# Patient Record
Sex: Female | Born: 1950 | Race: White | Hispanic: No | Marital: Married | Smoking: Never smoker
Health system: Southern US, Community
[De-identification: ages and names within clinical notes are randomized; demographics above are authoritative.]

## PROBLEM LIST (undated history)

## (undated) DIAGNOSIS — M8580 Other specified disorders of bone density and structure, unspecified site: Secondary | ICD-10-CM

## (undated) DIAGNOSIS — I10 Essential (primary) hypertension: Secondary | ICD-10-CM

---

## 1998-10-01 ENCOUNTER — Ambulatory Visit (HOSPITAL_COMMUNITY): Admission: RE | Admit: 1998-10-01 | Discharge: 1998-10-01 | Payer: Self-pay | Admitting: Family Medicine

## 2002-04-07 ENCOUNTER — Ambulatory Visit (HOSPITAL_COMMUNITY): Admission: RE | Admit: 2002-04-07 | Discharge: 2002-04-07 | Payer: Self-pay | Admitting: Gastroenterology

## 2003-08-19 ENCOUNTER — Ambulatory Visit (HOSPITAL_COMMUNITY): Admission: RE | Admit: 2003-08-19 | Discharge: 2003-08-19 | Payer: Self-pay | Admitting: Orthopedic Surgery

## 2003-08-19 ENCOUNTER — Encounter (INDEPENDENT_AMBULATORY_CARE_PROVIDER_SITE_OTHER): Payer: Self-pay | Admitting: *Deleted

## 2003-08-19 ENCOUNTER — Ambulatory Visit (HOSPITAL_BASED_OUTPATIENT_CLINIC_OR_DEPARTMENT_OTHER): Admission: RE | Admit: 2003-08-19 | Discharge: 2003-08-19 | Payer: Self-pay | Admitting: Orthopedic Surgery

## 2006-06-11 ENCOUNTER — Other Ambulatory Visit: Admission: RE | Admit: 2006-06-11 | Discharge: 2006-06-11 | Payer: Self-pay | Admitting: Family Medicine

## 2007-12-20 ENCOUNTER — Encounter: Admission: RE | Admit: 2007-12-20 | Discharge: 2007-12-20 | Payer: Self-pay | Admitting: Family Medicine

## 2010-12-30 NOTE — Op Note (Signed)
NAMELINDEY, RENZULLI                          ACCOUNT NO.:  [id number]   MEDICAL RECORD NO.:  [id number]                   PATIENT TYPE:  AMB   LOCATION:  DSC                                  FACILITY:  [facility]   PHYSICIAN:  Cindee Salt, M.D.                    DATE OF BIRTH:  1951/07/22   DATE OF PROCEDURE:  08/19/2003  DATE OF DISCHARGE:                                 OPERATIVE REPORT   PREOPERATIVE DIAGNOSIS:  Stenosing tenosynovitis, lesser sheath cyst, right  middle finger.   POSTOPERATIVE DIAGNOSIS:  Stenosing tenosynovitis, lesser sheath cyst, right  middle finger.   OPERATION PERFORMED:  Excision of cyst, release of A-1 pulley, right middle  finger.   SURGEON:  Cindee Salt, M.D.   ASSISTANTCarolyne Fiscal.   ANESTHESIA:  Forearm based  IV regional.   INDICATIONS FOR PROCEDURE:  The patient is a 60 year old female with a  history of triggering of her right middle finger.  She also has developed a  mass on the volar aspect.   DESCRIPTION OF PROCEDURE:  The patient was brought to the operating room  where a forearm based intravenous regional anesthetic was carried out  without difficulty.  She was prepped using DuraPrep in supine position with  the right arm free.  An oblique incision was made over the A-1 pulley,  carried down through subcutaneous tissue.  Bleeders were electrocauterized.  Neurovascular structures were identified and protected.  Retractors were  placed.  The A-1 pulley was identified.  The cyst was present at its distal  aspect.  This was removed and sent to pathology.  An incision was then made  on the radial aspect of the flexor sheath releasing this.  The finger placed  through a full range of motion.  Moderate tenosynovitis was present  proximally and this was excised.  The wound was irrigated.  Skin was closed  with interrupted 5-0 nylon suture.  Sterile compressive dressing was  applied.  The patient tolerated the procedure well and was taken to the  recovery room for observation in satisfactory condition.  She is discharged  to home to return to the [facility] [place] in one week on Vicodin  and Septra DS.                                               Cindee Salt, M.D.    Angelique Blonder  D:  08/19/2003  T:  08/19/2003  Job:  [id number]

## 2010-12-30 NOTE — Op Note (Signed)
   TNAMEJACOBA, CHERNEY                         ACCOUNT NO.:  [id number]   MEDICAL RECORD NO.:  [id number]                   PATIENT TYPE:  AMB   LOCATION:  ENDO                                 FACILITY:  [facility]   PHYSICIAN:  Barrie Folk, M.D.                  DATE OF BIRTH:  12/08/1950   DATE OF PROCEDURE:  DATE OF DISCHARGE:                                 OPERATIVE REPORT   PROCEDURE:  Colonoscopy.   INDICATIONS FOR PROCEDURE:  A 60 year old patient with anemia with a  hemoglobin of 10.9 but more recently had risen to 12.3.   DESCRIPTION OF PROCEDURE:  The patient was placed in the left lateral  decubitus position then placed on the pulse monitor with continuous low flow  oxygen delivered by nasal cannula. She was sedated with 80 mg IV Demerol and  9 mg IV Versed. The Olympus video colonoscope was inserted into the rectum  and advanced to the cecum, confirmed by transillumination at McBurney's  point and visualization of the ileocecal valve and appendiceal orifice. The  prep was excellent. The cecum, ascending, transverse, descending and sigmoid  colon all appeared normal with no masses, polyps, diverticula or other  mucosal abnormalities. The rectum likewise appeared normal and retroflexed  view of the anus revealed no obvious internal hemorrhoids. The colonoscope  was then withdrawn and the patient returned to the recovery room in stable  condition. The patient tolerated the procedure well and there were no  immediate complications.   IMPRESSION:  Normal colonoscopy.   PLAN:  Continue to monitor hemoglobin and consider upper EGD or upper GI  series of GI blood loss suspected, otherwise, flexible sigmoidoscopy in five  years.                                               Barrie Folk, M.D.    JCH/MEDQ  D:  04/07/2002  T:  04/08/2002  Job:  [id number]   cc:   Jethro Bastos, M.D.

## 2011-09-08 ENCOUNTER — Ambulatory Visit
Admission: RE | Admit: 2011-09-08 | Discharge: 2011-09-08 | Disposition: A | Discharge status: Home / Self Care | Payer: BC Managed Care – PPO | Source: Ambulatory Visit | Attending: Family Medicine | Admitting: Family Medicine

## 2011-09-08 ENCOUNTER — Other Ambulatory Visit: Payer: Self-pay | Admitting: Family Medicine

## 2011-09-08 DIAGNOSIS — M25559 Pain in unspecified hip: Secondary | ICD-10-CM

## 2011-09-13 ENCOUNTER — Ambulatory Visit: Payer: BC Managed Care – PPO | Attending: Family Medicine

## 2011-09-13 DIAGNOSIS — M25559 Pain in unspecified hip: Secondary | ICD-10-CM | POA: Insufficient documentation

## 2011-09-13 DIAGNOSIS — M899 Disorder of bone, unspecified: Secondary | ICD-10-CM | POA: Insufficient documentation

## 2011-09-13 DIAGNOSIS — M949 Disorder of cartilage, unspecified: Secondary | ICD-10-CM | POA: Insufficient documentation

## 2011-09-13 DIAGNOSIS — IMO0001 Reserved for inherently not codable concepts without codable children: Secondary | ICD-10-CM | POA: Insufficient documentation

## 2011-09-18 ENCOUNTER — Ambulatory Visit: Payer: BC Managed Care – PPO | Attending: Family Medicine

## 2011-09-18 DIAGNOSIS — IMO0001 Reserved for inherently not codable concepts without codable children: Secondary | ICD-10-CM | POA: Insufficient documentation

## 2011-09-18 DIAGNOSIS — M25559 Pain in unspecified hip: Secondary | ICD-10-CM | POA: Insufficient documentation

## 2011-09-18 DIAGNOSIS — M899 Disorder of bone, unspecified: Secondary | ICD-10-CM | POA: Insufficient documentation

## 2011-09-21 ENCOUNTER — Encounter: Payer: BC Managed Care – PPO | Admitting: Physical Therapy

## 2011-09-22 ENCOUNTER — Ambulatory Visit: Payer: BC Managed Care – PPO | Admitting: Physical Therapy

## 2011-09-22 ENCOUNTER — Encounter: Payer: BC Managed Care – PPO | Admitting: Physical Therapy

## 2011-09-26 ENCOUNTER — Ambulatory Visit: Payer: BC Managed Care – PPO

## 2011-09-29 ENCOUNTER — Ambulatory Visit: Payer: BC Managed Care – PPO | Admitting: Physical Therapy

## 2011-10-03 ENCOUNTER — Ambulatory Visit: Payer: BC Managed Care – PPO

## 2011-10-05 ENCOUNTER — Encounter: Payer: BC Managed Care – PPO | Admitting: Physical Therapy

## 2011-10-06 ENCOUNTER — Ambulatory Visit: Payer: BC Managed Care – PPO | Admitting: Physical Therapy

## 2011-10-10 ENCOUNTER — Ambulatory Visit: Payer: BC Managed Care – PPO

## 2011-10-16 ENCOUNTER — Ambulatory Visit: Payer: BC Managed Care – PPO | Attending: Family Medicine | Admitting: Physical Therapy

## 2011-10-16 DIAGNOSIS — M899 Disorder of bone, unspecified: Secondary | ICD-10-CM | POA: Insufficient documentation

## 2011-10-16 DIAGNOSIS — M949 Disorder of cartilage, unspecified: Secondary | ICD-10-CM | POA: Insufficient documentation

## 2011-10-16 DIAGNOSIS — M25559 Pain in unspecified hip: Secondary | ICD-10-CM | POA: Insufficient documentation

## 2011-10-16 DIAGNOSIS — IMO0001 Reserved for inherently not codable concepts without codable children: Secondary | ICD-10-CM | POA: Insufficient documentation

## 2011-10-20 ENCOUNTER — Ambulatory Visit: Payer: BC Managed Care – PPO | Admitting: Physical Therapy

## 2011-10-23 ENCOUNTER — Ambulatory Visit: Payer: BC Managed Care – PPO | Admitting: Physical Therapy

## 2011-10-25 ENCOUNTER — Ambulatory Visit: Payer: BC Managed Care – PPO

## 2012-01-16 ENCOUNTER — Other Ambulatory Visit (HOSPITAL_COMMUNITY)
Admission: RE | Admit: 2012-01-16 | Discharge: 2012-01-16 | Disposition: A | Discharge status: Home / Self Care | Payer: BC Managed Care – PPO | Source: Ambulatory Visit | Attending: Family Medicine | Admitting: Family Medicine

## 2012-01-16 ENCOUNTER — Other Ambulatory Visit: Payer: Self-pay | Admitting: Family Medicine

## 2012-01-16 DIAGNOSIS — Z01419 Encounter for gynecological examination (general) (routine) without abnormal findings: Secondary | ICD-10-CM | POA: Insufficient documentation

## 2012-01-16 DIAGNOSIS — Z1159 Encounter for screening for other viral diseases: Secondary | ICD-10-CM | POA: Insufficient documentation

## 2013-08-14 HISTORY — PX: TRIGGER FINGER RELEASE: SHX641

## 2015-07-19 ENCOUNTER — Other Ambulatory Visit: Payer: Self-pay | Admitting: Family Medicine

## 2015-07-19 DIAGNOSIS — I779 Disorder of arteries and arterioles, unspecified: Secondary | ICD-10-CM

## 2015-07-19 DIAGNOSIS — I739 Peripheral vascular disease, unspecified: Principal | ICD-10-CM

## 2015-07-26 ENCOUNTER — Ambulatory Visit
Admission: RE | Admit: 2015-07-26 | Discharge: 2015-07-26 | Disposition: A | Discharge status: Home / Self Care | Payer: BC Managed Care – PPO | Source: Ambulatory Visit | Attending: Family Medicine | Admitting: Family Medicine

## 2015-07-26 DIAGNOSIS — I739 Peripheral vascular disease, unspecified: Principal | ICD-10-CM

## 2015-07-26 DIAGNOSIS — I779 Disorder of arteries and arterioles, unspecified: Secondary | ICD-10-CM

## 2015-11-11 ENCOUNTER — Other Ambulatory Visit: Payer: Self-pay | Admitting: Orthopedic Surgery

## 2015-11-11 ENCOUNTER — Encounter (HOSPITAL_BASED_OUTPATIENT_CLINIC_OR_DEPARTMENT_OTHER)
Admission: RE | Admit: 2015-11-11 | Discharge: 2015-11-11 | Disposition: A | Discharge status: Home / Self Care | Payer: BC Managed Care – PPO | Source: Ambulatory Visit | Attending: Orthopedic Surgery | Admitting: Orthopedic Surgery

## 2015-11-11 ENCOUNTER — Encounter (HOSPITAL_BASED_OUTPATIENT_CLINIC_OR_DEPARTMENT_OTHER): Payer: Self-pay | Admitting: *Deleted

## 2015-11-11 ENCOUNTER — Other Ambulatory Visit: Payer: Self-pay

## 2015-11-11 DIAGNOSIS — Z01810 Encounter for preprocedural cardiovascular examination: Secondary | ICD-10-CM | POA: Diagnosis present

## 2015-11-11 DIAGNOSIS — I10 Essential (primary) hypertension: Secondary | ICD-10-CM | POA: Insufficient documentation

## 2015-11-16 ENCOUNTER — Encounter (HOSPITAL_BASED_OUTPATIENT_CLINIC_OR_DEPARTMENT_OTHER)
Admission: RE | Disposition: A | Discharge status: Home / Self Care | Payer: Self-pay | Source: Ambulatory Visit | Attending: Orthopedic Surgery

## 2015-11-16 ENCOUNTER — Ambulatory Visit (HOSPITAL_BASED_OUTPATIENT_CLINIC_OR_DEPARTMENT_OTHER): Payer: BC Managed Care – PPO | Admitting: Certified Registered"

## 2015-11-16 ENCOUNTER — Ambulatory Visit (HOSPITAL_BASED_OUTPATIENT_CLINIC_OR_DEPARTMENT_OTHER)
Admission: RE | Admit: 2015-11-16 | Discharge: 2015-11-16 | Disposition: A | Discharge status: Home / Self Care | Payer: BC Managed Care – PPO | Source: Ambulatory Visit | Attending: Orthopedic Surgery | Admitting: Orthopedic Surgery

## 2015-11-16 ENCOUNTER — Encounter (HOSPITAL_BASED_OUTPATIENT_CLINIC_OR_DEPARTMENT_OTHER): Payer: Self-pay | Admitting: Orthopedic Surgery

## 2015-11-16 DIAGNOSIS — I10 Essential (primary) hypertension: Secondary | ICD-10-CM | POA: Diagnosis not present

## 2015-11-16 DIAGNOSIS — M65332 Trigger finger, left middle finger: Secondary | ICD-10-CM | POA: Diagnosis not present

## 2015-11-16 DIAGNOSIS — Z79899 Other long term (current) drug therapy: Secondary | ICD-10-CM | POA: Diagnosis not present

## 2015-11-16 HISTORY — PX: TRIGGER FINGER RELEASE: SHX641

## 2015-11-16 HISTORY — DX: Essential (primary) hypertension: I10

## 2015-11-16 SURGERY — RELEASE, A1 PULLEY, FOR TRIGGER FINGER
Anesthesia: Monitor Anesthesia Care | Site: Finger | Laterality: Left

## 2015-11-16 MED ORDER — VANCOMYCIN HCL IN DEXTROSE 1-5 GM/200ML-% IV SOLN: 1000.0000 mg | INTRAVENOUS | Status: DC

## 2015-11-16 MED ORDER — LIDOCAINE HCL (PF) 0.5 % IJ SOLN
INTRAMUSCULAR | Status: DC | PRN
  Administered 2015-11-16: 30 mL via INTRAVENOUS

## 2015-11-16 MED ORDER — GLYCOPYRROLATE 0.2 MG/ML IJ SOLN: 0.2000 mg | Freq: Once | INTRAMUSCULAR | Status: DC | PRN

## 2015-11-16 MED ORDER — SCOPOLAMINE 1 MG/3DAYS TD PT72: 1.0000 | MEDICATED_PATCH | Freq: Once | TRANSDERMAL | Status: DC | PRN

## 2015-11-16 MED ORDER — LIDOCAINE HCL (CARDIAC) 20 MG/ML IV SOLN
INTRAVENOUS | Status: AC
  Filled 2015-11-16: qty 5

## 2015-11-16 MED ORDER — ONDANSETRON HCL 4 MG/2ML IJ SOLN
INTRAMUSCULAR | Status: AC
  Filled 2015-11-16: qty 2

## 2015-11-16 MED ORDER — ONDANSETRON HCL 4 MG/2ML IJ SOLN
INTRAMUSCULAR | Status: DC | PRN
  Administered 2015-11-16: 4 mg via INTRAVENOUS

## 2015-11-16 MED ORDER — PROMETHAZINE HCL 25 MG/ML IJ SOLN: 6.2500 mg | INTRAMUSCULAR | Status: DC | PRN

## 2015-11-16 MED ORDER — HYDROCODONE-ACETAMINOPHEN 5-325 MG PO TABS: 1.0000 | ORAL_TABLET | Freq: Four times a day (QID) | ORAL | Status: DC | PRN

## 2015-11-16 MED ORDER — VANCOMYCIN HCL IN DEXTROSE 1-5 GM/200ML-% IV SOLN
INTRAVENOUS | Status: AC
  Filled 2015-11-16: qty 200

## 2015-11-16 MED ORDER — FENTANYL CITRATE (PF) 100 MCG/2ML IJ SOLN: 25.0000 ug | INTRAMUSCULAR | Status: DC | PRN

## 2015-11-16 MED ORDER — MIDAZOLAM HCL 2 MG/2ML IJ SOLN
1.0000 mg | INTRAMUSCULAR | Status: DC | PRN
  Administered 2015-11-16: 1 mg via INTRAVENOUS

## 2015-11-16 MED ORDER — PROPOFOL 10 MG/ML IV BOLUS
INTRAVENOUS | Status: DC | PRN
  Administered 2015-11-16 (×3): 10 mg via INTRAVENOUS
  Administered 2015-11-16: 20 mg via INTRAVENOUS

## 2015-11-16 MED ORDER — MIDAZOLAM HCL 2 MG/2ML IJ SOLN
INTRAMUSCULAR | Status: AC
  Filled 2015-11-16: qty 2

## 2015-11-16 MED ORDER — LIDOCAINE HCL (CARDIAC) 20 MG/ML IV SOLN
INTRAVENOUS | Status: DC | PRN
  Administered 2015-11-16: 30 mg via INTRAVENOUS

## 2015-11-16 MED ORDER — MEPERIDINE HCL 25 MG/ML IJ SOLN: 6.2500 mg | INTRAMUSCULAR | Status: DC | PRN

## 2015-11-16 MED ORDER — CHLORHEXIDINE GLUCONATE 4 % EX LIQD: 60.0000 mL | Freq: Once | CUTANEOUS | Status: DC

## 2015-11-16 MED ORDER — FENTANYL CITRATE (PF) 100 MCG/2ML IJ SOLN
INTRAMUSCULAR | Status: AC
  Filled 2015-11-16: qty 2

## 2015-11-16 MED ORDER — LACTATED RINGERS IV SOLN
INTRAVENOUS | Status: DC
  Administered 2015-11-16: 10:00:00 via INTRAVENOUS

## 2015-11-16 MED ORDER — FENTANYL CITRATE (PF) 100 MCG/2ML IJ SOLN
50.0000 ug | INTRAMUSCULAR | Status: DC | PRN
Start: 2015-11-16 — End: 2015-11-16
  Administered 2015-11-16: 50 ug via INTRAVENOUS

## 2015-11-16 MED ORDER — BUPIVACAINE HCL (PF) 0.25 % IJ SOLN
INTRAMUSCULAR | Status: DC | PRN
  Administered 2015-11-16: 4 mL

## 2015-11-16 SURGICAL SUPPLY — 38 items
BANDAGE COBAN STERILE 2 (GAUZE/BANDAGES/DRESSINGS) ×3 IMPLANT
BLADE SURG 15 STRL LF DISP TIS (BLADE) ×1 IMPLANT
BLADE SURG 15 STRL SS (BLADE) ×3
BNDG CMPR 9X4 STRL LF SNTH (GAUZE/BANDAGES/DRESSINGS)
BNDG COHESIVE 1X5 TAN STRL LF (GAUZE/BANDAGES/DRESSINGS) ×2 IMPLANT
BNDG ESMARK 4X9 LF (GAUZE/BANDAGES/DRESSINGS) IMPLANT
CHLORAPREP W/TINT 26ML (MISCELLANEOUS) ×3 IMPLANT
CORDS BIPOLAR (ELECTRODE) IMPLANT
COVER BACK TABLE 60X90IN (DRAPES) ×3 IMPLANT
COVER MAYO STAND STRL (DRAPES) ×3 IMPLANT
CUFF TOURNIQUET SINGLE 18IN (TOURNIQUET CUFF) IMPLANT
DECANTER SPIKE VIAL GLASS SM (MISCELLANEOUS) IMPLANT
DRAPE EXTREMITY T 121X128X90 (DRAPE) ×3 IMPLANT
DRAPE SURG 17X23 STRL (DRAPES) ×3 IMPLANT
DRSG XEROFORM 1X8 (GAUZE/BANDAGES/DRESSINGS) ×2 IMPLANT
GAUZE SPONGE 4X4 12PLY STRL (GAUZE/BANDAGES/DRESSINGS) ×3 IMPLANT
GAUZE XEROFORM 1X8 LF (GAUZE/BANDAGES/DRESSINGS) ×3 IMPLANT
GLOVE BIO SURGEON STRL SZ 6 (GLOVE) ×2 IMPLANT
GLOVE BIO SURGEON STRL SZ 6.5 (GLOVE) ×1 IMPLANT
GLOVE BIO SURGEONS STRL SZ 6.5 (GLOVE) ×1
GLOVE BIOGEL PI IND STRL 8 (GLOVE) IMPLANT
GLOVE BIOGEL PI IND STRL 8.5 (GLOVE) ×1 IMPLANT
GLOVE BIOGEL PI INDICATOR 8 (GLOVE) ×2
GLOVE BIOGEL PI INDICATOR 8.5 (GLOVE) ×2
GLOVE SURG ORTHO 8.0 STRL STRW (GLOVE) ×3 IMPLANT
GOWN STRL REUS W/ TWL LRG LVL3 (GOWN DISPOSABLE) ×1 IMPLANT
GOWN STRL REUS W/TWL LRG LVL3 (GOWN DISPOSABLE) ×3
GOWN STRL REUS W/TWL XL LVL3 (GOWN DISPOSABLE) ×3 IMPLANT
NDL PRECISIONGLIDE 27X1.5 (NEEDLE) ×1 IMPLANT
NEEDLE PRECISIONGLIDE 27X1.5 (NEEDLE) ×3 IMPLANT
NS IRRIG 1000ML POUR BTL (IV SOLUTION) ×3 IMPLANT
PACK BASIN DAY SURGERY FS (CUSTOM PROCEDURE TRAY) ×3 IMPLANT
STOCKINETTE 4X48 STRL (DRAPES) ×3 IMPLANT
SUT ETHILON 4 0 PS 2 18 (SUTURE) ×6 IMPLANT
SYR BULB 3OZ (MISCELLANEOUS) ×3 IMPLANT
SYR CONTROL 10ML LL (SYRINGE) ×3 IMPLANT
TOWEL OR 17X24 6PK STRL BLUE (TOWEL DISPOSABLE) ×6 IMPLANT
UNDERPAD 30X30 (UNDERPADS AND DIAPERS) ×3 IMPLANT

## 2015-11-16 NOTE — Transfer of Care (Signed)
Immediate Anesthesia Transfer of Care Note  Patient: Samantha ShellSusan Steele  Procedure(s) Performed: Procedure(s) with comments: RELEASE TRIGGER FINGER/A-1 PULLEY LEFT MIDDLE FINGER (Left) - middle finger  Patient Location: PACU  Anesthesia Type:Bier block  Level of Consciousness: awake, alert , oriented and patient cooperative  Airway & Oxygen Therapy: Patient Spontanous Breathing and Patient connected to face mask oxygen  Post-op Assessment: Report given to RN and Post -op Vital signs reviewed and stable  Post vital signs: Reviewed and stable  Last Vitals:  Filed Vitals:   11/16/15 1014  BP: 143/65  Pulse: 68  Temp: 36.7 C  Resp: 18    Complications: No apparent anesthesia complications

## 2015-11-16 NOTE — Anesthesia Preprocedure Evaluation (Addendum)
Anesthesia Evaluation  Patient identified by MRN, date of birth, ID band Patient awake    Reviewed: Allergy & Precautions, NPO status , Patient's Chart, lab work & pertinent test results  Airway Mallampati: II  TM Distance: >3 FB Neck ROM: Full    Dental no notable dental hx.    Pulmonary neg pulmonary ROS,    Pulmonary exam normal breath sounds clear to auscultation       Cardiovascular hypertension, Pt. on medications Normal cardiovascular exam Rhythm:Regular Rate:Normal     Neuro/Psych negative neurological ROS  negative psych ROS   GI/Hepatic negative GI ROS, Neg liver ROS,   Endo/Other  negative endocrine ROS  Renal/GU negative Renal ROS  negative genitourinary   Musculoskeletal negative musculoskeletal ROS (+)   Abdominal   Peds negative pediatric ROS (+)  Hematology negative hematology ROS (+)   Anesthesia Other Findings   Reproductive/Obstetrics negative OB ROS                           Anesthesia Physical Anesthesia Plan  ASA: II  Anesthesia Plan: MAC and Bier Block   Post-op Pain Management:    Induction:   Airway Management Planned:   Additional Equipment:   Intra-op Plan:   Post-operative Plan:   Informed Consent: I have reviewed the patients History and Physical, chart, labs and discussed the procedure including the risks, benefits and alternatives for the proposed anesthesia with the patient or authorized representative who has indicated his/her understanding and acceptance.   Dental advisory given  Plan Discussed with: CRNA  Anesthesia Plan Comments:         Anesthesia Quick Evaluation

## 2015-11-16 NOTE — Anesthesia Procedure Notes (Signed)
Anesthesia Regional Block:  Bier block (IV Regional)  Pre-Anesthetic Checklist: ,, timeout performed, Correct Patient, Correct Site, Correct Laterality, Correct Procedure,, site marked, surgical consent,, at surgeon's request Needles:  Injection technique: Single-shot  Needle Type: Other      Needle Gauge: 20 and 20 G    Additional Needles: Bier block (IV Regional) Narrative:   Performed by: Personally    Procedure Name: MAC Date/Time: 11/16/2015 10:45 AM Performed by: Dorin Stooksbury D Pre-anesthesia Checklist: Patient identified, Emergency Drugs available, Suction available, Patient being monitored and Timeout performed Patient Re-evaluated:Patient Re-evaluated prior to inductionOxygen Delivery Method: Simple face mask

## 2015-11-16 NOTE — Brief Op Note (Signed)
11/16/2015  11:15 AM  PATIENT:  Samantha ShellSusan Steele  65 y.o. female  PRE-OPERATIVE DIAGNOSIS:  TRIGGER LEFT MIDDLE FINGER  POST-OPERATIVE DIAGNOSIS:  TRIGGER LEFT MIDDLE FINGE  PROCEDURE:  Procedure(s) with comments: RELEASE TRIGGER FINGER/A-1 PULLEY LEFT MIDDLE FINGER (Left) - middle finger  SURGEON:  Surgeon(s) and Role:    * Cindee SaltGary Martita Brumm, MD - Primary  PHYSICIAN ASSISTANT:   ASSISTANTS: none   ANESTHESIA:   local and regional  EBL:  Total I/O In: 500 [I.V.:500] Out: -   BLOOD ADMINISTERED:none  DRAINS: none   LOCAL MEDICATIONS USED:  BUPIVICAINE   SPECIMEN:  No Specimen  DISPOSITION OF SPECIMEN:  N/A  COUNTS:  YES  TOURNIQUET:   Total Tourniquet Time Documented: Upper Arm (Left) - 15 minutes Total: Upper Arm (Left) - 15 minutes   DICTATION: .Other Dictation: Dictation Number [id number]  PLAN OF CARE: Discharge to home after PACU  PATIENT DISPOSITION:  PACU - hemodynamically stable.

## 2015-11-16 NOTE — Op Note (Signed)
Dictation Number [id number]

## 2015-11-16 NOTE — Discharge Instructions (Addendum)

## 2015-11-16 NOTE — Anesthesia Postprocedure Evaluation (Signed)
Anesthesia Post Note  Patient: Samantha ShellSusan Steele  Procedure(s) Performed: Procedure(s) (LRB): RELEASE TRIGGER FINGER/A-1 PULLEY LEFT MIDDLE FINGER (Left)  Patient location during evaluation: PACU Anesthesia Type: MAC and Regional Level of consciousness: awake and alert Pain management: pain level controlled Vital Signs Assessment: post-procedure vital signs reviewed and stable Respiratory status: spontaneous breathing, nonlabored ventilation, respiratory function stable and patient connected to nasal cannula oxygen Cardiovascular status: stable and blood pressure returned to baseline Anesthetic complications: no    Last Vitals:  Filed Vitals:   11/16/15 1145 11/16/15 1215  BP: 125/65 144/70  Pulse: 56 59  Temp:  36.5 C  Resp: 9 16    Last Pain:  Filed Vitals:   11/16/15 1218  PainSc: 0-No pain                 Decou Groutarignan, Jordyan Hardiman

## 2015-11-16 NOTE — H&P (Signed)
Samantha Steele is an 65 y.o. female.   Chief Complaint: catching left middle finger HPI: Samantha Steele e is 53, and right-handed complains of catching of her left middle finger. This has been going on for approximately ten days. She recalls no history of injury. She states it is particularly bad in the mornings. She has a history of arthritis. No history of diabetes, thyroid problems or gout. There is a family history of diabetes, arthritis and gout. She has no numbness or tingling with this. She really has not taken anything for this. She also has triggering of her right ring finger. She has undergone a surgical release of her right ring finger by me years ago.She has had two injections to her ring finger, right hand. Her middle finger, left hand has  received injections on two occasions. The ring finger, right hand continues to trigger, but is not painful for her. The middle finger on her left hand triggers and is painful for her. She localizes pain over the metacarpophalangeal joint volar aspect of the left middle finger.   Past Medical History  Diagnosis Date  . Hypertension   Past Surgical History  Procedure Laterality Date  . Hand surgery    Past Medical History  Diagnosis Date  . Hypertension     Past Surgical History  Procedure Laterality Date  . Trigger finger release  2015    History reviewed. No pertinent family history. Social History:  reports that she has never smoked. She does not have any smokeless tobacco history on file. She reports that she does not drink alcohol or use illicit drugs.  Allergies:  Allergies  Allergen Reactions  . Ampicillin Rash  . Erythromycin Hives  . Statins Other (See Comments)    Foggy head    Medications Prior to Admission  Medication Sig Dispense Refill  . losartan (COZAAR) 25 MG tablet Take 25 mg by mouth daily.      No results found for this or any previous visit (from the past 48 hour(s)).  No results found.   Pertinent  items are noted in HPI.  Height  (1.626 m), weight 65.318 kg (144 lb).  General appearance: alert, cooperative and appears stated age Head: Normocephalic, without obvious abnormality Neck: no JVD Resp: clear to auscultation bilaterally Cardio: regular rate and rhythm, S1, S2 normal, no murmur, click, rub or gallop GI: soft, non-tender; bowel sounds normal; no masses,  no organomegaly Extremities: catching left middle finger Pulses: 2+ and symmetric Skin: Skin color, texture, turgor normal. No rashes or lesions Neurologic: Grossly normal Incision/Wound: na  Assessment/Plan DIAGNOSIS: Trigger finger symptomatic left side and relatively asymptomatic but lack in her right ring finger.  She is desirous of proceeding to have the left middle finger surgically release. She has had releases done in the past. The pre and postoperative course were discussed along with risks and complications. She is aware there is no guarantee with the surgery, possibility of infection, recurrence, injury to arteries, nerves, tendons, incomplete relief of dystrophy. She is scheduled for release A1 pulley, left middle finger as an outpatient under regional anesthesia. PLAN: She is desirous of proceeding to have the left middle finger surgically release. She has had releases done in the past. The pre and postoperative course were discussed along with risks and complications. She is aware there is no guarantee with the surgery, possibility of infection, recurrence, injury to arteries, nerves, tendons, incomplete relief of dystrophy. She is scheduled for release A1 pulley, left middle finger as  an outpatient under regional anesthesia.Patient does not want an antibiotic.   Samantha Steele R 11/16/2015, 9:58 AM

## 2015-11-17 ENCOUNTER — Encounter (HOSPITAL_BASED_OUTPATIENT_CLINIC_OR_DEPARTMENT_OTHER): Payer: Self-pay | Admitting: Orthopedic Surgery

## 2015-11-17 NOTE — Op Note (Signed)
[name], Samantha              ACCOUNT NO.:  [id number]  MEDICAL RECORD NO.:  [id number]  LOCATION:                                 FACILITY:  PHYSICIAN:  Cindee SaltGary Makail Watling, M.D.            DATE OF BIRTH:  DATE OF PROCEDURE:  11/16/2015 DATE OF DISCHARGE:                              OPERATIVE REPORT   PREOPERATIVE DIAGNOSIS:  Stenosing tenosynovitis, left middle finger.  POSTOPERATIVE DIAGNOSIS:  Stenosing tenosynovitis, left middle finger.  OPERATION:  Release of A1 pulley, left middle finger.  SURGEON:  Cindee SaltGary Nobuo Nunziata, M.D.  ANESTHESIA:  Forearm-based IV regional with local infiltration.  ANESTHESIOLOGIST:  Sheldon Silvanavid Crews, M.D.  HISTORY:  The patient is a 65 year old female with history of triggering of her left middle finger.  This has not responded to multiple injections.  She has elected to undergo surgical release.  Pre, peri, and postoperative course have been discussed along with risks and complications.  She is aware that there was no guarantee with the surgery; possibility of infection; recurrence of injury to arteries, nerves, tendons; incomplete relief of symptoms; dystrophy.  In the preoperative area, the patient is seen, the extremity marked by both the patient and surgeon.  She refused antibiotics.  DESCRIPTION OF PROCEDURE:  A time-out was taken.  An oblique incision was made over the A1 pulley of left middle finger, carried down through the subcutaneous tissue.  She had some sensation present despite the block and sedation.  A local infiltration with 0.25% bupivacaine without epinephrine was given, approximately 5 mL was used.  The wound was then deepened down to the A1 pulley.  The A1 pulley was identified, released on its radial aspect after placement of retractors protecting neurovascular bundles radially and ulnarly.  The small incision was made centrally in A2.  The two tendons were then separated removing adhesions between the two.  A partial tenosynovectomy  performed proximally.  The finger was placed through a full passive motion and no further triggering was noted.  The wound was copiously irrigated with saline and the skin closed with interrupted 4-0 nylon sutures.  A sterile compressive dressing with the fingers free was applied.  On deflation of the tourniquet, all fingers were immediately pinked.  She was taken to the recovery room for observation in satisfactory condition.  She will be discharged to home [place] [facility] [place] [facility] [place], on Norco.          ______________________________ Cindee SaltGary Aundray Cartlidge, M.D.     GK/MEDQ  D:  11/16/2015  T:  11/17/2015  Job:  [id number]

## 2017-03-19 ENCOUNTER — Other Ambulatory Visit: Payer: Self-pay | Admitting: Orthopedic Surgery

## 2017-03-26 ENCOUNTER — Encounter (HOSPITAL_BASED_OUTPATIENT_CLINIC_OR_DEPARTMENT_OTHER): Payer: Self-pay | Admitting: *Deleted

## 2017-04-03 ENCOUNTER — Ambulatory Visit (HOSPITAL_BASED_OUTPATIENT_CLINIC_OR_DEPARTMENT_OTHER): Payer: Medicare Other | Admitting: Anesthesiology

## 2017-04-03 ENCOUNTER — Ambulatory Visit (HOSPITAL_BASED_OUTPATIENT_CLINIC_OR_DEPARTMENT_OTHER)
Admission: RE | Admit: 2017-04-03 | Discharge: 2017-04-03 | Disposition: A | Discharge status: Home / Self Care | Payer: Medicare Other | Source: Ambulatory Visit | Attending: Orthopedic Surgery | Admitting: Orthopedic Surgery

## 2017-04-03 ENCOUNTER — Encounter (HOSPITAL_BASED_OUTPATIENT_CLINIC_OR_DEPARTMENT_OTHER): Payer: Self-pay | Admitting: Anesthesiology

## 2017-04-03 ENCOUNTER — Encounter (HOSPITAL_BASED_OUTPATIENT_CLINIC_OR_DEPARTMENT_OTHER)
Admission: RE | Disposition: A | Discharge status: Home / Self Care | Payer: Self-pay | Source: Ambulatory Visit | Attending: Orthopedic Surgery

## 2017-04-03 DIAGNOSIS — M1990 Unspecified osteoarthritis, unspecified site: Secondary | ICD-10-CM | POA: Diagnosis not present

## 2017-04-03 DIAGNOSIS — Z8261 Family history of arthritis: Secondary | ICD-10-CM | POA: Insufficient documentation

## 2017-04-03 DIAGNOSIS — Z8489 Family history of other specified conditions: Secondary | ICD-10-CM | POA: Diagnosis not present

## 2017-04-03 DIAGNOSIS — M659 Synovitis and tenosynovitis, unspecified: Secondary | ICD-10-CM | POA: Insufficient documentation

## 2017-04-03 DIAGNOSIS — Z880 Allergy status to penicillin: Secondary | ICD-10-CM | POA: Insufficient documentation

## 2017-04-03 DIAGNOSIS — Z833 Family history of diabetes mellitus: Secondary | ICD-10-CM | POA: Insufficient documentation

## 2017-04-03 DIAGNOSIS — Z881 Allergy status to other antibiotic agents status: Secondary | ICD-10-CM | POA: Diagnosis not present

## 2017-04-03 DIAGNOSIS — Z888 Allergy status to other drugs, medicaments and biological substances status: Secondary | ICD-10-CM | POA: Diagnosis not present

## 2017-04-03 DIAGNOSIS — I10 Essential (primary) hypertension: Secondary | ICD-10-CM | POA: Diagnosis not present

## 2017-04-03 HISTORY — PX: TRIGGER FINGER RELEASE: SHX641

## 2017-04-03 SURGERY — RELEASE, A1 PULLEY, FOR TRIGGER FINGER
Anesthesia: Monitor Anesthesia Care | Site: Finger | Laterality: Right

## 2017-04-03 MED ORDER — MEPERIDINE HCL 25 MG/ML IJ SOLN: 6.2500 mg | INTRAMUSCULAR | Status: DC | PRN

## 2017-04-03 MED ORDER — KETOROLAC TROMETHAMINE 30 MG/ML IJ SOLN
INTRAMUSCULAR | Status: AC
  Filled 2017-04-03: qty 2

## 2017-04-03 MED ORDER — ONDANSETRON HCL 4 MG/2ML IJ SOLN
INTRAMUSCULAR | Status: AC
  Filled 2017-04-03: qty 2

## 2017-04-03 MED ORDER — HYDROCODONE-ACETAMINOPHEN 5-325 MG PO TABS: 1.0000 | ORAL_TABLET | Freq: Four times a day (QID) | ORAL | 0 refills | Status: DC | PRN

## 2017-04-03 MED ORDER — FENTANYL CITRATE (PF) 100 MCG/2ML IJ SOLN
INTRAMUSCULAR | Status: AC
  Filled 2017-04-03: qty 2

## 2017-04-03 MED ORDER — FENTANYL CITRATE (PF) 100 MCG/2ML IJ SOLN: 50.0000 ug | INTRAMUSCULAR | Status: DC | PRN

## 2017-04-03 MED ORDER — LIDOCAINE 2% (20 MG/ML) 5 ML SYRINGE
INTRAMUSCULAR | Status: AC
  Filled 2017-04-03: qty 5

## 2017-04-03 MED ORDER — ACETAMINOPHEN 160 MG/5ML PO SOLN: 325.0000 mg | ORAL | Status: DC | PRN

## 2017-04-03 MED ORDER — FENTANYL CITRATE (PF) 100 MCG/2ML IJ SOLN
INTRAMUSCULAR | Status: DC | PRN
  Administered 2017-04-03: 100 ug via INTRAVENOUS

## 2017-04-03 MED ORDER — MIDAZOLAM HCL 2 MG/2ML IJ SOLN
INTRAMUSCULAR | Status: AC
  Filled 2017-04-03: qty 2

## 2017-04-03 MED ORDER — LACTATED RINGERS IV SOLN
INTRAVENOUS | Status: DC
  Administered 2017-04-03: 10:00:00 via INTRAVENOUS

## 2017-04-03 MED ORDER — ACETAMINOPHEN 325 MG PO TABS: 325.0000 mg | ORAL_TABLET | ORAL | Status: DC | PRN

## 2017-04-03 MED ORDER — VANCOMYCIN HCL IN DEXTROSE 1-5 GM/200ML-% IV SOLN
1000.0000 mg | INTRAVENOUS | Status: AC
  Administered 2017-04-03: 1000 mg via INTRAVENOUS

## 2017-04-03 MED ORDER — SCOPOLAMINE 1 MG/3DAYS TD PT72: 1.0000 | MEDICATED_PATCH | Freq: Once | TRANSDERMAL | Status: DC | PRN

## 2017-04-03 MED ORDER — PROPOFOL 500 MG/50ML IV EMUL
INTRAVENOUS | Status: AC
  Filled 2017-04-03: qty 50

## 2017-04-03 MED ORDER — PROPOFOL 10 MG/ML IV BOLUS
INTRAVENOUS | Status: DC | PRN
  Administered 2017-04-03: 20 mg via INTRAVENOUS

## 2017-04-03 MED ORDER — MIDAZOLAM HCL 2 MG/2ML IJ SOLN: 1.0000 mg | INTRAMUSCULAR | Status: DC | PRN

## 2017-04-03 MED ORDER — LIDOCAINE HCL (PF) 0.5 % IJ SOLN
INTRAMUSCULAR | Status: DC | PRN
  Administered 2017-04-03: 30 mL via INTRAVENOUS

## 2017-04-03 MED ORDER — OXYCODONE HCL 5 MG/5ML PO SOLN: 5.0000 mg | Freq: Once | ORAL | Status: DC | PRN

## 2017-04-03 MED ORDER — VANCOMYCIN HCL IN DEXTROSE 1-5 GM/200ML-% IV SOLN
INTRAVENOUS | Status: AC
  Filled 2017-04-03: qty 200

## 2017-04-03 MED ORDER — ONDANSETRON HCL 4 MG/2ML IJ SOLN: 4.0000 mg | Freq: Once | INTRAMUSCULAR | Status: DC | PRN

## 2017-04-03 MED ORDER — OXYCODONE HCL 5 MG PO TABS: 5.0000 mg | ORAL_TABLET | Freq: Once | ORAL | Status: DC | PRN

## 2017-04-03 MED ORDER — KETOROLAC TROMETHAMINE 30 MG/ML IJ SOLN
INTRAMUSCULAR | Status: DC | PRN
  Administered 2017-04-03: 30 mg via INTRAVENOUS

## 2017-04-03 MED ORDER — BUPIVACAINE HCL (PF) 0.5 % IJ SOLN
INTRAMUSCULAR | Status: AC
  Filled 2017-04-03: qty 30

## 2017-04-03 MED ORDER — FENTANYL CITRATE (PF) 100 MCG/2ML IJ SOLN: 25.0000 ug | INTRAMUSCULAR | Status: DC | PRN

## 2017-04-03 MED ORDER — BUPIVACAINE HCL (PF) 0.5 % IJ SOLN
INTRAMUSCULAR | Status: DC | PRN
  Administered 2017-04-03: 6 mL

## 2017-04-03 MED ORDER — KETOROLAC TROMETHAMINE 30 MG/ML IJ SOLN: 30.0000 mg | Freq: Once | INTRAMUSCULAR | Status: DC | PRN

## 2017-04-03 MED ORDER — LIDOCAINE HCL (PF) 1 % IJ SOLN
INTRAMUSCULAR | Status: AC
  Filled 2017-04-03: qty 5

## 2017-04-03 MED ORDER — MIDAZOLAM HCL 5 MG/5ML IJ SOLN
INTRAMUSCULAR | Status: DC | PRN
  Administered 2017-04-03: 2 mg via INTRAVENOUS

## 2017-04-03 MED ORDER — CHLORHEXIDINE GLUCONATE 4 % EX LIQD: 60.0000 mL | Freq: Once | CUTANEOUS | Status: DC

## 2017-04-03 MED ORDER — 0.9 % SODIUM CHLORIDE (POUR BTL) OPTIME
TOPICAL | Status: DC | PRN
  Administered 2017-04-03: 120 mL

## 2017-04-03 SURGICAL SUPPLY — 36 items
BANDAGE COBAN STERILE 2 (GAUZE/BANDAGES/DRESSINGS) ×3 IMPLANT
BLADE SURG 15 STRL LF DISP TIS (BLADE) ×1 IMPLANT
BLADE SURG 15 STRL SS (BLADE) ×3
BNDG CMPR 9X4 STRL LF SNTH (GAUZE/BANDAGES/DRESSINGS)
BNDG ESMARK 4X9 LF (GAUZE/BANDAGES/DRESSINGS) IMPLANT
CHLORAPREP W/TINT 26ML (MISCELLANEOUS) ×3 IMPLANT
CORD BIPOLAR FORCEPS 12FT (ELECTRODE) ×2 IMPLANT
COVER BACK TABLE 60X90IN (DRAPES) ×3 IMPLANT
COVER MAYO STAND STRL (DRAPES) ×3 IMPLANT
CUFF TOURNIQUET SINGLE 18IN (TOURNIQUET CUFF) ×2 IMPLANT
DECANTER SPIKE VIAL GLASS SM (MISCELLANEOUS) IMPLANT
DRAPE EXTREMITY T 121X128X90 (DRAPE) ×3 IMPLANT
DRAPE SURG 17X23 STRL (DRAPES) ×3 IMPLANT
GAUZE SPONGE 4X4 12PLY STRL (GAUZE/BANDAGES/DRESSINGS) ×3 IMPLANT
GAUZE XEROFORM 1X8 LF (GAUZE/BANDAGES/DRESSINGS) ×3 IMPLANT
GLOVE BIO SURGEON STRL SZ 6.5 (GLOVE) ×1 IMPLANT
GLOVE BIO SURGEONS STRL SZ 6.5 (GLOVE) ×1
GLOVE BIOGEL PI IND STRL 7.0 (GLOVE) IMPLANT
GLOVE BIOGEL PI IND STRL 8.5 (GLOVE) ×1 IMPLANT
GLOVE BIOGEL PI INDICATOR 7.0 (GLOVE) ×2
GLOVE BIOGEL PI INDICATOR 8.5 (GLOVE) ×2
GLOVE EXAM NITRILE MD LF STRL (GLOVE) ×2 IMPLANT
GLOVE SURG ORTHO 8.0 STRL STRW (GLOVE) ×3 IMPLANT
GOWN STRL REUS W/ TWL LRG LVL3 (GOWN DISPOSABLE) ×1 IMPLANT
GOWN STRL REUS W/TWL LRG LVL3 (GOWN DISPOSABLE) ×3
GOWN STRL REUS W/TWL XL LVL3 (GOWN DISPOSABLE) ×3 IMPLANT
NDL PRECISIONGLIDE 27X1.5 (NEEDLE) ×1 IMPLANT
NEEDLE PRECISIONGLIDE 27X1.5 (NEEDLE) ×3 IMPLANT
NS IRRIG 1000ML POUR BTL (IV SOLUTION) ×3 IMPLANT
PACK BASIN DAY SURGERY FS (CUSTOM PROCEDURE TRAY) ×3 IMPLANT
STOCKINETTE 4X48 STRL (DRAPES) ×3 IMPLANT
SUT ETHILON 4 0 PS 2 18 (SUTURE) ×3 IMPLANT
SYR BULB 3OZ (MISCELLANEOUS) ×3 IMPLANT
SYR CONTROL 10ML LL (SYRINGE) ×3 IMPLANT
TOWEL OR 17X24 6PK STRL BLUE (TOWEL DISPOSABLE) ×6 IMPLANT
UNDERPAD 30X30 (UNDERPADS AND DIAPERS) ×3 IMPLANT

## 2017-04-03 NOTE — Anesthesia Procedure Notes (Signed)
Anesthesia Regional Block: Bier block (IV Regional)   Pre-Anesthetic Checklist: ,, timeout performed, Correct Patient, Correct Site, Correct Laterality, Correct Procedure, Correct Position, site marked, Risks and benefits discussed,  Surgical consent,  Pre-op evaluation,  At surgeon's request and post-op pain management  Laterality: Right  Prep: chloraprep        Procedures:,,,,,,, Esmarch exsanguination,  Narrative:  Start time: 04/03/2017 10:56 AM End time: 04/03/2017 3:59 PM  Events: blood aspirated,,,,,,,,,,

## 2017-04-03 NOTE — Anesthesia Postprocedure Evaluation (Signed)
Anesthesia Post Note  Patient: Samantha Steele  Procedure(s) Performed: Procedure(s) (LRB): RELEASE TRIGGER FINGER/A-1 PULLEY RIGHT RING (Right)     Patient location during evaluation: PACU Anesthesia Type: MAC and Bier Block Level of consciousness: awake Pain management: pain level controlled Vital Signs Assessment: post-procedure vital signs reviewed and stable Respiratory status: spontaneous breathing Cardiovascular status: stable Postop Assessment: no signs of nausea or vomiting Anesthetic complications: no    Last Vitals:  Vitals:   04/03/17 1145 04/03/17 1200  BP: (!) 147/79 (!) 155/61  Pulse: 61 (!) 57  Resp: 13 16  Temp:  36.6 C  SpO2: 100% 100%    Last Pain:  Vitals:   04/03/17 1200  TempSrc:   PainSc: 0-No pain   Pain Goal:                 Lamika Connolly JR,JOHN Emilene Roma

## 2017-04-03 NOTE — Op Note (Signed)
Samantha Steele, Samantha Steele              ACCOUNT NO.:  [id number]  MEDICAL RECORD NO.:  [id number]  LOCATION:                                 FACILITY:  PHYSICIAN:  Cindee Salt, M.D.            DATE OF BIRTH:  DATE OF PROCEDURE:  04/03/2017 DATE OF DISCHARGE:                              OPERATIVE REPORT   PREOPERATIVE DIAGNOSIS:  Stenosing tenosynovitis, right ring finger.  POSTOPERATIVE DIAGNOSIS:  Stenosing tenosynovitis, right ring finger.  OPERATION:  Release of A1 pulley, right ring finger.  SURGEON:  Cindee Salt, M.D.  ANESTHESIA:  Forearm-based IV regional with local infiltration.  PLACE OF SURGERY:  [facility].  HISTORY:  The patient is a 66 year old female with a history of triggering of her right ring finger.  This has not responded to conservative treatment.  She has elected to undergo surgical release of the A1 pulley.  Pre, peri, and postoperative course have been discussed along with risks and complications.  She is aware that there is no guarantee to the surgery, the possibility of infection; recurrence of injury to arteries, nerves, tendons; incomplete relief of symptoms; dystrophy.  In the preoperative area, the patient was seen, the extremity marked by both the patient and surgeon.  Antibiotic given.  PROCEDURE IN DETAIL:  The patient was brought to the operating room, where a forearm-based IV regional anesthetic was carried out without difficulty under the direction of the Anesthesia Department.  She was prepped using ChloraPrep in a supine position with the right arm free. A 3-minute dry time was allowed, and a time-out taken, confirming the patient and procedure.  An oblique incision was made over the A1 pulley of the right ring finger.  This was carried down through the subcutaneous tissue.  Bleeders were electrocauterized.  The dissection was carried down to the A1 pulley.  Retractors were placed radial and ulnarly protecting the neurovascular  bundles on each side.  An incision was then made on the radial aspect of the A1 pulley and a small incision made centrally in the A2 pulley.  The 2 finger tendons were then separated bluntly removing any adhesions.  The finger passively placed through a full range of motion.  No further triggering was noted.  The local infiltration with 0.25% bupivacaine was given and closure done with interrupted 4-0 nylon sutures after irrigation of the wound.  The bupivacaine injection locally was approximately 7 mL.  A sterile compressive dressing with fingers free was applied.  On deflation of the tourniquet, all fingers immediately pinked.  She was taken to the recovery room for observation in satisfactory condition.  She will be discharged home to return to the [facility] [place] in 1 week, on Norco.          ______________________________ Cindee Salt, M.D.     GK/MEDQ  D:  04/03/2017  T:  04/03/2017  Job:  [id number]

## 2017-04-03 NOTE — Transfer of Care (Signed)
Immediate Anesthesia Transfer of Care Note  Patient: Samantha Steele  Procedure(s) Performed: Procedure(s): RELEASE TRIGGER FINGER/A-1 PULLEY RIGHT RING (Right)  Patient Location: PACU  Anesthesia Type:Bier block  Level of Consciousness: awake and patient cooperative  Airway & Oxygen Therapy: Patient Spontanous Breathing and Patient connected to face mask oxygen  Post-op Assessment: Report given to RN and Post -op Vital signs reviewed and stable  Post vital signs: Reviewed and stable  Last Vitals:  Vitals:   04/03/17 0946  BP: (!) 148/73  Pulse: 70  Resp: 16  Temp: 36.5 C  SpO2: 100%    Last Pain:  Vitals:   04/03/17 0946  TempSrc: Oral         Complications: No apparent anesthesia complications

## 2017-04-03 NOTE — Brief Op Note (Signed)
04/03/2017  11:16 AM  PATIENT:  Samantha Steele  66 y.o. female  PRE-OPERATIVE DIAGNOSIS:  trigger right ring finger  M65.341  POST-OPERATIVE DIAGNOSIS:  trigger right ring finger  M65.341  PROCEDURE:  Procedure(s): RELEASE TRIGGER FINGER/A-1 PULLEY RIGHT RING (Right)  SURGEON:  Surgeon(s) and Role:    Cindee Salt, MD - Primary  PHYSICIAN ASSISTANT:   ASSISTANTS: none   ANESTHESIA:   local and regional  EBL:  Total I/O In: -  Out: 1 [Blood:1]  BLOOD ADMINISTERED:none  DRAINS: none   LOCAL MEDICATIONS USED:  BUPIVICAINE   SPECIMEN:  No Specimen  DISPOSITION OF SPECIMEN:  N/A  COUNTS:  YES  TOURNIQUET:   Total Tourniquet Time Documented: Forearm (Right) - 19 minutes Total: Forearm (Right) - 19 minutes   DICTATION: .Other Dictation: Dictation Number [id number]  PLAN OF CARE: Discharge to home after PACU  PATIENT DISPOSITION:  PACU - hemodynamically stable.

## 2017-04-03 NOTE — Anesthesia Preprocedure Evaluation (Signed)
Anesthesia Evaluation  Patient identified by MRN, date of birth, ID band Patient awake    Reviewed: Allergy & Precautions, NPO status , Patient's Chart, lab work & pertinent test results  Airway Mallampati: II  TM Distance: >3 FB Neck ROM: Full    Dental no notable dental hx. (+) Teeth Intact   Pulmonary neg pulmonary ROS,    Pulmonary exam normal breath sounds clear to auscultation       Cardiovascular hypertension, Pt. on medications Normal cardiovascular exam Rhythm:Regular Rate:Normal     Neuro/Psych negative neurological ROS  negative psych ROS   GI/Hepatic negative GI ROS, Neg liver ROS,   Endo/Other  negative endocrine ROS  Renal/GU negative Renal ROS  negative genitourinary   Musculoskeletal negative musculoskeletal ROS (+)   Abdominal Normal abdominal exam  (+)   Peds negative pediatric ROS (+)  Hematology negative hematology ROS (+)   Anesthesia Other Findings   Reproductive/Obstetrics negative OB ROS                             Anesthesia Physical  Anesthesia Plan  ASA: II  Anesthesia Plan: MAC and Bier Block   Post-op Pain Management:    Induction:   PONV Risk Score and Plan:   Airway Management Planned: Natural Airway and Simple Face Mask  Additional Equipment:   Intra-op Plan:   Post-operative Plan:   Informed Consent: I have reviewed the patients History and Physical, chart, labs and discussed the procedure including the risks, benefits and alternatives for the proposed anesthesia with the patient or authorized representative who has indicated his/her understanding and acceptance.     Plan Discussed with: CRNA and Surgeon  Anesthesia Plan Comments:         Anesthesia Quick Evaluation

## 2017-04-03 NOTE — H&P (Signed)
Samantha Steele is an 66 y.o. female.   Chief Complaint: catching right ring finger HPI:Samantha Steele is a former patient whom I have not seen in several years. She is 69, and right-handed complains of catching of her left middle finger. This has been going on for approximately ten days. She recalls no history of injury. She states it is particularly bad in the mornings. She has a history of arthritis. No history of diabetes, thyroid problems or gout. There is a family history of diabetes, arthritis and gout. She has no numbness or tingling with this. She really has not taken anything for this. She also has triggering of her right ring finger. She has undergone a surgical release of her right ring finger by me years ago.She has had this injected on 2 occasions. She states that approximately 2-3 months ago this return. She comes complains of discomfort especially in the morning. Discomfort is mild with a VAS score 2/10 aching in nature. She states it is not as bad as the other fingers she has had to have released.           Past Medical History:  Diagnosis Date  . Hypertension     Past Surgical History:  Procedure Laterality Date  . TRIGGER FINGER RELEASE  2015  . TRIGGER FINGER RELEASE Left 11/16/2015   Procedure: RELEASE TRIGGER FINGER/A-1 PULLEY LEFT MIDDLE FINGER;  Surgeon: Cindee Salt, MD;  Location: [facility];  Service: Orthopedics;  Laterality: Left;  middle finger    History reviewed. No pertinent family history. Social History:  reports that she has never smoked. She has never used smokeless tobacco. She reports that she does not drink alcohol or use drugs.  Allergies:  Allergies  Allergen Reactions  . Ampicillin Rash  . Erythromycin Hives  . Statins Other (See Comments)    Foggy head    No prescriptions prior to admission.    No results found for this or any previous visit (from the past 48 hour(s)).  No results found.   Pertinent items are noted in  HPI.  Height 5\' 4"  (1.626 m), weight 62.6 kg (138 lb).  General appearance: alert, cooperative and appears stated age Head: Normocephalic, without obvious abnormality Neck: no JVD Resp: clear to auscultation bilaterally Cardio: regular rate and rhythm, S1, S2 normal, no murmur, click, rub or gallop GI: soft, non-tender; bowel sounds normal; no masses,  no organomegaly Extremities: catching right ring finger Pulses: 2+ and symmetric Skin: Skin color, texture, turgor normal. No rashes or lesions Neurologic: Grossly normal Incision/Wound: na  Assessment/Plan Assessment:  1. Trigger finger, right ring finger    Plan: She would like to proceed to have this surgically released. Prepare postoperative course are discussed along with risk applications. She is where there is no guarantee to the surgery the possibility of infection recurrence injury to arteries nerves tendons complete relief symptoms and dystrophy. Release her right ring finger A1 pulley is scheduled as an outpatient under regional anesthesia.      Benedetto Ryder R 04/03/2017, 8:28 AM

## 2017-04-03 NOTE — Op Note (Signed)
Dictation Number [id number]

## 2017-04-03 NOTE — Discharge Instructions (Signed)

## 2017-04-04 ENCOUNTER — Encounter (HOSPITAL_BASED_OUTPATIENT_CLINIC_OR_DEPARTMENT_OTHER): Payer: Self-pay | Admitting: Orthopedic Surgery

## 2020-01-20 DIAGNOSIS — M7581 Other shoulder lesions, right shoulder: Secondary | ICD-10-CM | POA: Diagnosis not present

## 2020-03-25 DIAGNOSIS — H5211 Myopia, right eye: Secondary | ICD-10-CM | POA: Diagnosis not present

## 2020-03-29 DIAGNOSIS — H26491 Other secondary cataract, right eye: Secondary | ICD-10-CM | POA: Diagnosis not present

## 2020-03-30 ENCOUNTER — Ambulatory Visit: Payer: Medicare Other | Admitting: Physical Therapy

## 2020-04-01 DIAGNOSIS — Z20822 Contact with and (suspected) exposure to covid-19: Secondary | ICD-10-CM | POA: Diagnosis not present

## 2020-04-01 DIAGNOSIS — J029 Acute pharyngitis, unspecified: Secondary | ICD-10-CM | POA: Diagnosis not present

## 2020-04-01 DIAGNOSIS — R5383 Other fatigue: Secondary | ICD-10-CM | POA: Diagnosis not present

## 2020-04-01 DIAGNOSIS — R05 Cough: Secondary | ICD-10-CM | POA: Diagnosis not present

## 2020-04-06 ENCOUNTER — Ambulatory Visit: Payer: Medicare Other | Admitting: Physical Therapy

## 2020-04-06 DIAGNOSIS — E7800 Pure hypercholesterolemia, unspecified: Secondary | ICD-10-CM | POA: Diagnosis not present

## 2020-04-06 DIAGNOSIS — I10 Essential (primary) hypertension: Secondary | ICD-10-CM | POA: Diagnosis not present

## 2020-04-06 DIAGNOSIS — Z0001 Encounter for general adult medical examination with abnormal findings: Secondary | ICD-10-CM | POA: Diagnosis not present

## 2020-04-06 DIAGNOSIS — Z79899 Other long term (current) drug therapy: Secondary | ICD-10-CM | POA: Diagnosis not present

## 2020-04-06 DIAGNOSIS — E559 Vitamin D deficiency, unspecified: Secondary | ICD-10-CM | POA: Diagnosis not present

## 2020-04-06 DIAGNOSIS — M25511 Pain in right shoulder: Secondary | ICD-10-CM | POA: Diagnosis not present

## 2020-04-08 DIAGNOSIS — Z1211 Encounter for screening for malignant neoplasm of colon: Secondary | ICD-10-CM | POA: Diagnosis not present

## 2020-04-13 ENCOUNTER — Encounter: Payer: Medicare Other | Admitting: Physical Therapy

## 2020-04-20 ENCOUNTER — Encounter: Payer: Medicare Other | Admitting: Physical Therapy

## 2020-05-11 DIAGNOSIS — Z23 Encounter for immunization: Secondary | ICD-10-CM | POA: Diagnosis not present

## 2020-05-20 DIAGNOSIS — E7800 Pure hypercholesterolemia, unspecified: Secondary | ICD-10-CM | POA: Diagnosis not present

## 2020-06-11 DIAGNOSIS — L918 Other hypertrophic disorders of the skin: Secondary | ICD-10-CM | POA: Diagnosis not present

## 2020-06-11 DIAGNOSIS — L820 Inflamed seborrheic keratosis: Secondary | ICD-10-CM | POA: Diagnosis not present

## 2020-06-11 DIAGNOSIS — D1801 Hemangioma of skin and subcutaneous tissue: Secondary | ICD-10-CM | POA: Diagnosis not present

## 2020-06-11 DIAGNOSIS — L720 Epidermal cyst: Secondary | ICD-10-CM | POA: Diagnosis not present

## 2020-06-11 DIAGNOSIS — L570 Actinic keratosis: Secondary | ICD-10-CM | POA: Diagnosis not present

## 2020-06-11 DIAGNOSIS — L661 Lichen planopilaris: Secondary | ICD-10-CM | POA: Diagnosis not present

## 2020-06-11 DIAGNOSIS — L821 Other seborrheic keratosis: Secondary | ICD-10-CM | POA: Diagnosis not present

## 2020-06-21 DIAGNOSIS — M75111 Incomplete rotator cuff tear or rupture of right shoulder, not specified as traumatic: Secondary | ICD-10-CM | POA: Diagnosis not present

## 2020-06-21 DIAGNOSIS — M25511 Pain in right shoulder: Secondary | ICD-10-CM | POA: Diagnosis not present

## 2020-06-28 DIAGNOSIS — Z1231 Encounter for screening mammogram for malignant neoplasm of breast: Secondary | ICD-10-CM | POA: Diagnosis not present

## 2020-07-02 DIAGNOSIS — R059 Cough, unspecified: Secondary | ICD-10-CM | POA: Diagnosis not present

## 2020-07-02 DIAGNOSIS — Z03818 Encounter for observation for suspected exposure to other biological agents ruled out: Secondary | ICD-10-CM | POA: Diagnosis not present

## 2020-07-02 DIAGNOSIS — R058 Other specified cough: Secondary | ICD-10-CM | POA: Diagnosis not present

## 2020-07-02 DIAGNOSIS — M25511 Pain in right shoulder: Secondary | ICD-10-CM | POA: Diagnosis not present

## 2021-04-01 DIAGNOSIS — Z809 Family history of malignant neoplasm, unspecified: Secondary | ICD-10-CM | POA: Diagnosis not present

## 2021-04-01 DIAGNOSIS — Z823 Family history of stroke: Secondary | ICD-10-CM | POA: Diagnosis not present

## 2021-04-01 DIAGNOSIS — Z833 Family history of diabetes mellitus: Secondary | ICD-10-CM | POA: Diagnosis not present

## 2021-04-01 DIAGNOSIS — Z8249 Family history of ischemic heart disease and other diseases of the circulatory system: Secondary | ICD-10-CM | POA: Diagnosis not present

## 2021-04-01 DIAGNOSIS — Z85828 Personal history of other malignant neoplasm of skin: Secondary | ICD-10-CM | POA: Diagnosis not present

## 2021-04-01 DIAGNOSIS — I10 Essential (primary) hypertension: Secondary | ICD-10-CM | POA: Diagnosis not present

## 2021-04-01 DIAGNOSIS — Z79890 Hormone replacement therapy (postmenopausal): Secondary | ICD-10-CM | POA: Diagnosis not present

## 2021-04-01 DIAGNOSIS — Z825 Family history of asthma and other chronic lower respiratory diseases: Secondary | ICD-10-CM | POA: Diagnosis not present

## 2021-04-01 DIAGNOSIS — N898 Other specified noninflammatory disorders of vagina: Secondary | ICD-10-CM | POA: Diagnosis not present

## 2021-04-19 DIAGNOSIS — H524 Presbyopia: Secondary | ICD-10-CM | POA: Diagnosis not present

## 2021-04-25 DIAGNOSIS — N898 Other specified noninflammatory disorders of vagina: Secondary | ICD-10-CM | POA: Diagnosis not present

## 2021-04-25 DIAGNOSIS — E7800 Pure hypercholesterolemia, unspecified: Secondary | ICD-10-CM | POA: Diagnosis not present

## 2021-04-25 DIAGNOSIS — Z79899 Other long term (current) drug therapy: Secondary | ICD-10-CM | POA: Diagnosis not present

## 2021-04-25 DIAGNOSIS — E2839 Other primary ovarian failure: Secondary | ICD-10-CM | POA: Diagnosis not present

## 2021-04-25 DIAGNOSIS — Z0001 Encounter for general adult medical examination with abnormal findings: Secondary | ICD-10-CM | POA: Diagnosis not present

## 2021-04-25 DIAGNOSIS — I10 Essential (primary) hypertension: Secondary | ICD-10-CM | POA: Diagnosis not present

## 2021-04-27 DIAGNOSIS — Z780 Asymptomatic menopausal state: Secondary | ICD-10-CM | POA: Diagnosis not present

## 2021-04-27 DIAGNOSIS — M810 Age-related osteoporosis without current pathological fracture: Secondary | ICD-10-CM | POA: Diagnosis not present

## 2021-04-27 DIAGNOSIS — M8589 Other specified disorders of bone density and structure, multiple sites: Secondary | ICD-10-CM | POA: Diagnosis not present

## 2021-04-29 DIAGNOSIS — Z23 Encounter for immunization: Secondary | ICD-10-CM | POA: Diagnosis not present

## 2021-04-29 DIAGNOSIS — Z1211 Encounter for screening for malignant neoplasm of colon: Secondary | ICD-10-CM | POA: Diagnosis not present

## 2021-05-03 DIAGNOSIS — N952 Postmenopausal atrophic vaginitis: Secondary | ICD-10-CM | POA: Diagnosis not present

## 2021-05-03 DIAGNOSIS — N814 Uterovaginal prolapse, unspecified: Secondary | ICD-10-CM | POA: Diagnosis not present

## 2021-05-03 DIAGNOSIS — N8110 Cystocele, unspecified: Secondary | ICD-10-CM | POA: Diagnosis not present

## 2021-05-03 DIAGNOSIS — N941 Unspecified dyspareunia: Secondary | ICD-10-CM | POA: Diagnosis not present

## 2021-06-14 DIAGNOSIS — L738 Other specified follicular disorders: Secondary | ICD-10-CM | POA: Diagnosis not present

## 2021-06-14 DIAGNOSIS — L661 Lichen planopilaris: Secondary | ICD-10-CM | POA: Diagnosis not present

## 2021-06-14 DIAGNOSIS — L918 Other hypertrophic disorders of the skin: Secondary | ICD-10-CM | POA: Diagnosis not present

## 2021-06-14 DIAGNOSIS — D1801 Hemangioma of skin and subcutaneous tissue: Secondary | ICD-10-CM | POA: Diagnosis not present

## 2021-06-14 DIAGNOSIS — L570 Actinic keratosis: Secondary | ICD-10-CM | POA: Diagnosis not present

## 2021-06-14 DIAGNOSIS — D225 Melanocytic nevi of trunk: Secondary | ICD-10-CM | POA: Diagnosis not present

## 2021-06-14 DIAGNOSIS — L821 Other seborrheic keratosis: Secondary | ICD-10-CM | POA: Diagnosis not present

## 2021-07-18 DIAGNOSIS — R102 Pelvic and perineal pain: Secondary | ICD-10-CM | POA: Diagnosis not present

## 2021-07-25 DIAGNOSIS — R102 Pelvic and perineal pain: Secondary | ICD-10-CM | POA: Diagnosis not present

## 2021-07-28 DIAGNOSIS — M25512 Pain in left shoulder: Secondary | ICD-10-CM | POA: Diagnosis not present

## 2021-08-02 DIAGNOSIS — R102 Pelvic and perineal pain: Secondary | ICD-10-CM | POA: Diagnosis not present

## 2021-08-16 DIAGNOSIS — M25512 Pain in left shoulder: Secondary | ICD-10-CM | POA: Diagnosis not present

## 2021-09-02 ENCOUNTER — Emergency Department (HOSPITAL_COMMUNITY): Payer: Medicare PPO

## 2021-09-02 ENCOUNTER — Inpatient Hospital Stay (HOSPITAL_COMMUNITY)
Admission: EM | Admit: 2021-09-02 | Discharge: 2021-09-04 | DRG: 176 | Disposition: A | Discharge status: Home / Self Care | Payer: Medicare PPO | Attending: Internal Medicine | Admitting: Internal Medicine

## 2021-09-02 ENCOUNTER — Encounter (HOSPITAL_COMMUNITY): Payer: Self-pay | Admitting: Emergency Medicine

## 2021-09-02 DIAGNOSIS — Z20822 Contact with and (suspected) exposure to covid-19: Secondary | ICD-10-CM | POA: Diagnosis present

## 2021-09-02 DIAGNOSIS — E785 Hyperlipidemia, unspecified: Secondary | ICD-10-CM | POA: Diagnosis present

## 2021-09-02 DIAGNOSIS — R000 Tachycardia, unspecified: Secondary | ICD-10-CM

## 2021-09-02 DIAGNOSIS — N189 Chronic kidney disease, unspecified: Secondary | ICD-10-CM | POA: Insufficient documentation

## 2021-09-02 DIAGNOSIS — Z79899 Other long term (current) drug therapy: Secondary | ICD-10-CM

## 2021-09-02 DIAGNOSIS — M8580 Other specified disorders of bone density and structure, unspecified site: Secondary | ICD-10-CM | POA: Diagnosis present

## 2021-09-02 DIAGNOSIS — Z888 Allergy status to other drugs, medicaments and biological substances status: Secondary | ICD-10-CM

## 2021-09-02 DIAGNOSIS — I10 Essential (primary) hypertension: Secondary | ICD-10-CM | POA: Diagnosis present

## 2021-09-02 DIAGNOSIS — Z881 Allergy status to other antibiotic agents status: Secondary | ICD-10-CM

## 2021-09-02 DIAGNOSIS — Z79890 Hormone replacement therapy (postmenopausal): Secondary | ICD-10-CM

## 2021-09-02 DIAGNOSIS — I82451 Acute embolism and thrombosis of right peroneal vein: Secondary | ICD-10-CM | POA: Diagnosis present

## 2021-09-02 DIAGNOSIS — I2699 Other pulmonary embolism without acute cor pulmonale: Principal | ICD-10-CM | POA: Diagnosis present

## 2021-09-02 DIAGNOSIS — Z880 Allergy status to penicillin: Secondary | ICD-10-CM

## 2021-09-02 DIAGNOSIS — I779 Disorder of arteries and arterioles, unspecified: Secondary | ICD-10-CM | POA: Insufficient documentation

## 2021-09-02 DIAGNOSIS — R0602 Shortness of breath: Secondary | ICD-10-CM | POA: Diagnosis present

## 2021-09-02 DIAGNOSIS — R252 Cramp and spasm: Secondary | ICD-10-CM | POA: Diagnosis not present

## 2021-09-02 DIAGNOSIS — R0600 Dyspnea, unspecified: Secondary | ICD-10-CM | POA: Diagnosis not present

## 2021-09-02 HISTORY — DX: Other specified disorders of bone density and structure, unspecified site: M85.80

## 2021-09-02 LAB — CBC WITH DIFFERENTIAL/PLATELET
Abs Immature Granulocytes: 0.03 10*3/uL (ref 0.00–0.07)
Basophils Absolute: 0.1 10*3/uL (ref 0.0–0.1)
Basophils Relative: 1 %
Eosinophils Absolute: 0.2 10*3/uL (ref 0.0–0.5)
Eosinophils Relative: 2 %
HCT: 45.8 % (ref 36.0–46.0)
Hemoglobin: 15 g/dL (ref 12.0–15.0)
Immature Granulocytes: 0 %
Lymphocytes Relative: 19 %
Lymphs Abs: 1.5 10*3/uL (ref 0.7–4.0)
MCH: 29.7 pg (ref 26.0–34.0)
MCHC: 32.8 g/dL (ref 30.0–36.0)
MCV: 90.7 fL (ref 80.0–100.0)
Monocytes Absolute: 0.7 10*3/uL (ref 0.1–1.0)
Monocytes Relative: 8 %
Neutro Abs: 5.6 10*3/uL (ref 1.7–7.7)
Neutrophils Relative %: 70 %
Platelets: 199 10*3/uL (ref 150–400)
RBC: 5.05 MIL/uL (ref 3.87–5.11)
RDW: 12.2 % (ref 11.5–15.5)
WBC: 8 10*3/uL (ref 4.0–10.5)
nRBC: 0 % (ref 0.0–0.2)

## 2021-09-02 LAB — BRAIN NATRIURETIC PEPTIDE: B Natriuretic Peptide: 77 pg/mL (ref 0.0–100.0)

## 2021-09-02 LAB — COMPREHENSIVE METABOLIC PANEL
ALT: 22 U/L (ref 0–44)
AST: 23 U/L (ref 15–41)
Albumin: 4.2 g/dL (ref 3.5–5.0)
Alkaline Phosphatase: 60 U/L (ref 38–126)
Anion gap: 10 (ref 5–15)
BUN: 17 mg/dL (ref 8–23)
CO2: 22 mmol/L (ref 22–32)
Calcium: 9.7 mg/dL (ref 8.9–10.3)
Chloride: 108 mmol/L (ref 98–111)
Creatinine, Ser: 1.07 mg/dL — ABNORMAL HIGH (ref 0.44–1.00)
GFR, Estimated: 56 mL/min — ABNORMAL LOW (ref >60)
Glucose, Bld: 142 mg/dL — ABNORMAL HIGH (ref 70–99)
Potassium: 3.7 mmol/L (ref 3.5–5.1)
Sodium: 140 mmol/L (ref 135–145)
Total Bilirubin: 0.8 mg/dL (ref 0.3–1.2)
Total Protein: 7 g/dL (ref 6.5–8.1)

## 2021-09-02 LAB — TROPONIN I (HIGH SENSITIVITY)
Troponin I (High Sensitivity): 39 ng/L — ABNORMAL HIGH (ref <18)
Troponin I (High Sensitivity): 66 ng/L — ABNORMAL HIGH (ref <18)

## 2021-09-02 LAB — RESP PANEL BY RT-PCR (FLU A&B, COVID) ARPGX2
Influenza A by PCR: NEGATIVE
Influenza B by PCR: NEGATIVE
SARS Coronavirus 2 by RT PCR: NEGATIVE

## 2021-09-02 MED ORDER — IOHEXOL 350 MG/ML SOLN
65.0000 mL | Freq: Once | INTRAVENOUS | Status: AC | PRN
  Administered 2021-09-02: 65 mL via INTRAVENOUS

## 2021-09-02 MED ORDER — HEPARIN BOLUS VIA INFUSION
4900.0000 [IU] | Freq: Once | INTRAVENOUS | Status: AC
  Administered 2021-09-02: 4900 [IU] via INTRAVENOUS
  Filled 2021-09-02: qty 4900

## 2021-09-02 MED ORDER — HEPARIN (PORCINE) 25000 UT/250ML-% IV SOLN
1200.0000 [IU]/h | INTRAVENOUS | Status: DC
  Administered 2021-09-02: 1200 [IU]/h via INTRAVENOUS
  Filled 2021-09-02: qty 250

## 2021-09-02 NOTE — H&P (Signed)
History and Physical    Samantha Steele VHO:[id number] DOB: 1950/11/09 DOA: 09/02/2021  PCP: Farris Has, MD   Patient coming from: Home  I have personally briefly reviewed patient's old medical records in [facility]  CC:SOB HPI: 71 year old female with a history of hypertension, hyperlipidemia presents the ER today with sudden onset of shortness of breath after taking a walk with her husband this evening.  Patient only able to make it about 100 yards before feeling extremely short winded having to come back to the house.  Patient brought back to the ER.  Patient denies any recent lower extremity trauma.  No recent surgeries.  Patient does not smoke.  No personal history of cancer.  Patient does not use any hormone replacement therapy.  Patient noted to be tachycardic and short winded.  CTPA demonstrated bilateral PE.  BNP was normal.  Critical care was consulted.  Patient was deemed not to need ICU level care.  [facility] hospitalist contacted for admission.  Patient started on IV heparin per EDP.   ED Course: CTPA shows bilateral PE. PCCM consulted. They declined admission to ICU.  Review of Systems:  Review of Systems  Constitutional:  Positive for malaise/fatigue. Negative for chills, fever and weight loss.  HENT: Negative.  Negative for ear pain, hearing loss and tinnitus.   Eyes: Negative.  Negative for blurred vision, double vision and photophobia.  Respiratory:  Positive for shortness of breath.   Cardiovascular:  Negative for chest pain, leg swelling and PND.  Gastrointestinal: Negative.  Negative for heartburn, nausea and vomiting.  Genitourinary: Negative.  Negative for dysuria, frequency and urgency.  Musculoskeletal: Negative.  Negative for back pain, myalgias and neck pain.  Skin: Negative.   Neurological: Negative.   Endo/Heme/Allergies: Negative.   Psychiatric/Behavioral: Negative.    All other systems reviewed and are negative.  Past Medical History:   Diagnosis Date   Hypertension    Osteopenia     Past Surgical History:  Procedure Laterality Date   TRIGGER FINGER RELEASE  2015   TRIGGER FINGER RELEASE Left 11/16/2015   Procedure: RELEASE TRIGGER FINGER/A-1 PULLEY LEFT MIDDLE FINGER;  Surgeon: Cindee Salt, MD;  Location: [facility];  Service: Orthopedics;  Laterality: Left;  middle finger   TRIGGER FINGER RELEASE Right 04/03/2017   Procedure: RELEASE TRIGGER FINGER/A-1 PULLEY RIGHT RING;  Surgeon: Cindee Salt, MD;  Location: [facility];  Service: Orthopedics;  Laterality: Right;     reports that she has never smoked. She has never used smokeless tobacco. She reports that she does not drink alcohol and does not use drugs.  Allergies  Allergen Reactions   Ampicillin Rash   Erythromycin Hives   Statins Other (See Comments)    Foggy head   Alendronate Sodium     Other reaction(s): jaw pain   Venlafaxine     Other reaction(s): non-therpeutic    Family History  Problem Relation Age of Onset   Stroke Sister     Prior to Admission medications   Medication Sig Start Date End Date Taking? Authorizing Provider  acetaminophen (TYLENOL) 500 MG tablet Take 500 mg by mouth every 6 (six) hours as needed for moderate pain.   Yes [provider]  calcium carbonate (OSCAL) 1500 (600 Ca) MG TABS tablet Take 1,500 mg by mouth daily.   Yes [provider]  Cholecalciferol (VITAMIN D3) 50 MCG (2000 UT) capsule Take 2,000 Units by mouth daily.   Yes [provider]  estradiol (ESTRACE) 0.1  MG/GM vaginal cream Place 1 Applicatorful vaginally once a week. 04/25/21  Yes [provider]  losartan (COZAAR) 25 MG tablet Take 25 mg by mouth daily.   Yes [provider]  rosuvastatin (CRESTOR) 5 MG tablet Take 2.5 mg by mouth every Monday, Wednesday, and Friday. 05/28/21  Yes [provider]  HYDROcodone-acetaminophen (NORCO) 5-325 MG tablet Take 1 tablet by mouth every 6  (six) hours as needed. Patient not taking: Reported on 09/02/2021 04/03/17   Cindee SaltKuzma, Gary, MD    Physical Exam: Vitals:   09/02/21 2115 09/02/21 2115 09/02/21 2215 09/02/21 2230  BP: (!) 176/92  (!) 130/93 (!) 140/98  Pulse: 83  86 89  Resp: 12  12 12   Temp:      TempSrc:      SpO2: 96%  97% 95%  Weight:  66.2 kg    Height:  5' 3.5" (1.613 m)      Physical Exam   Labs on Admission: I have personally reviewed following labs and imaging studies  CBC: Recent Labs  Lab 09/02/21 1529  WBC 8.0  NEUTROABS 5.6  HGB 15.0  HCT 45.8  MCV 90.7  PLT 199   Basic Metabolic Panel: Recent Labs  Lab 09/02/21 1529  NA 140  K 3.7  CL 108  CO2 22  GLUCOSE 142*  BUN 17  CREATININE 1.07*  CALCIUM 9.7   GFR: Estimated Creatinine Clearance: 44.6 mL/min (A) (by C-G formula based on SCr of 1.07 mg/dL (H)). Liver Function Tests: Recent Labs  Lab 09/02/21 1529  AST 23  ALT 22  ALKPHOS 60  BILITOT 0.8  PROT 7.0  ALBUMIN 4.2   No results for input(s): LIPASE, AMYLASE in the last 168 hours. No results for input(s): AMMONIA in the last 168 hours. Coagulation Profile: No results for input(s): INR, PROTIME in the last 168 hours. Cardiac Enzymes: No results for input(s): CKTOTAL, CKMB, CKMBINDEX, TROPONINI in the last 168 hours. BNP (last 3 results) No results for input(s): PROBNP in the last 8760 hours. HbA1C: No results for input(s): HGBA1C in the last 72 hours. CBG: No results for input(s): GLUCAP in the last 168 hours. Lipid Profile: No results for input(s): CHOL, HDL, LDLCALC, TRIG, CHOLHDL, LDLDIRECT in the last 72 hours. Thyroid Function Tests: No results for input(s): TSH, T4TOTAL, FREET4, T3FREE, THYROIDAB in the last 72 hours. Anemia Panel: No results for input(s): VITAMINB12, FOLATE, FERRITIN, TIBC, IRON, RETICCTPCT in the last 72 hours. Urine analysis: No results found for: COLORURINE, APPEARANCEUR, LABSPEC, PHURINE, GLUCOSEU, HGBUR, BILIRUBINUR, KETONESUR,  PROTEINUR, UROBILINOGEN, NITRITE, LEUKOCYTESUR  Radiological Exams on Admission: I have personally reviewed images DG Chest 2 View  Result Date: 09/02/2021 CLINICAL DATA:  Tachycardia. EXAM: CHEST - 2 VIEW COMPARISON:  Chest x-ray 12/20/2007. FINDINGS: The heart size and mediastinal contours are within normal limits. Both lungs are clear. The visualized skeletal structures are unremarkable. IMPRESSION: No active cardiopulmonary disease. Electronically Signed   By: Darliss CheneyAmy  Guttmann M.D.   On: 09/02/2021 15:57   CT Angio Chest PE W and/or Wo Contrast  Result Date: 09/02/2021 CLINICAL DATA:  Shortness of breath and tachycardia EXAM: CT ANGIOGRAPHY CHEST WITH CONTRAST TECHNIQUE: Multidetector CT imaging of the chest was performed using the standard protocol during bolus administration of intravenous contrast. Multiplanar CT image reconstructions and MIPs were obtained to evaluate the vascular anatomy. RADIATION DOSE REDUCTION: This exam was performed according to the departmental dose-optimization program which includes automated exposure control, adjustment of the mA and/or kV according to patient size  and/or use of iterative reconstruction technique. CONTRAST:  31mL OMNIPAQUE IOHEXOL 350 MG/ML SOLN COMPARISON:  Chest x-ray 09/02/2021 FINDINGS: Cardiovascular: Satisfactory opacification of the pulmonary arteries to the segmental level. Positive for acute thrombus within the right upper lobar and segmental vessels. Acute thrombus within right middle lobe segmental and subsegmental vessels with thrombus present in the right descending pulmonary artery and right lower lobe segmental and subsegmental vessels. Acute thrombus present within left upper lobe subsegmental vessels, and multiple left lower lobe segmental and subsegmental vessels. Elevated RV LV ratio of 1.18. There appears to be hypertrophy of the left ventricle. Normal cardiac size. No pericardial effusion. Nonaneurysmal aorta. Mediastinum/Nodes: Midline  trachea. No thyroid mass. No suspicious lymph nodes. Esophagus within normal limits Lungs/Pleura: No consolidation, pleural effusion or pneumothorax. Mild mosaic density which may be due to diffuse atelectasis, small airways disease or potential mosaic perfusion given bilateral PE. Upper Abdomen: No acute abnormality.  Splenic granuloma. Musculoskeletal: No chest wall abnormality. No acute or significant osseous findings. Review of the MIP images confirms the above findings. IMPRESSION: Positive for acute right greater than left pulmonary emboli. Positive for acute PE with CT evidence of right heart strain (RV/LV Ratio = 1.18) consistent with at least submassive (intermediate risk) PE. The presence of right heart strain has been associated with an increased risk of morbidity and mortality. Please refer to the "PE Focused" order set in EPIC. Negative for acute airspace disease. Critical Value/emergent results were called by telephone at the time of interpretation on 09/02/2021 at 7:56 pm to provider [facility] , who verbally acknowledged these results. Electronically Signed   By: Jasmine Pang M.D.   On: 09/02/2021 19:56    EKG: I have personally reviewed EKG: sinus tachycardia   Assessment/Plan Principal Problem:   Acute pulmonary embolism (HCC) Active Problems:   Essential hypertension   Hyperlipidemia    Acute pulmonary embolism (HCC) Admit to observation telemetry bed. Check LE U/s. Check echo. EDP placed pt on IV heparin. PCCM consulted. If echo shows no RV strain, switch to NOAC tomorrow. Pt denies any personal hx of cancer, does not smoke, does not use any HRT. No recent long distance car travel or airplane flights. No recent trauma to legs.  Essential hypertension Stable. Continue losartan.  Hyperlipidemia Continue crestor.  DVT prophylaxis: IV heparin gtts Code Status:  agreeable to CPR/ACLS meds/defibrillation/cardioversion. Does NOT want intubation. Family Communication:  discussed with pt and husband at bedside  Disposition Plan: return home  Consults called: PCCM  Admission status: Observation, Med-Surg   Carollee Herter, DO [facility] 09/02/2021, 11:16 PM

## 2021-09-02 NOTE — ED Triage Notes (Signed)
Patient complains of sudden onset of shortness of breath and feeling like her heart was racing at 1430 today while walking. HR 127 in triage, SpO2 97% on room air. Patient alert, oriented, and in no apparent distress at this time.

## 2021-09-02 NOTE — Subjective & Objective (Signed)
CC:SOB HPI: 71 year old female with a history of hypertension, hyperlipidemia presents the ER today with sudden onset of shortness of breath after taking a walk with her husband this evening.  Patient only able to make it about 100 yards before feeling extremely short winded having to come back to the house.  Patient brought back to the ER.  Patient denies any recent lower extremity trauma.  No recent surgeries.  Patient does not smoke.  No personal history of cancer.  Patient does not use any hormone replacement therapy.  Patient noted to be tachycardic and short winded.  CTPA demonstrated bilateral PE.  BNP was normal.  Critical care was consulted.  Patient was deemed not to need ICU level care.  [facility] hospitalist contacted for admission.  Patient started on IV heparin per EDP.

## 2021-09-02 NOTE — ED Provider Notes (Signed)
[facility]   CSN: [id number] Arrival date & time: 09/02/21  1450     History  Chief Complaint  Patient presents with   Tachycardia    Samantha ShellSusan Beske is a 71 y.o. female.  The history is provided by the patient and medical records. No language interpreter was used.  Shortness of Breath Severity:  Severe Onset quality:  Sudden Duration:  5 hours Timing:  Constant Progression:  Unchanged Chronicity:  New Context: activity   Context: not URI   Relieved by:  Nothing Worsened by:  Exertion Ineffective treatments:  None tried Associated symptoms: no abdominal pain, no chest pain, no cough, no diaphoresis, no fever, no headaches, no hemoptysis, no neck pain, no rash, no sputum production, no swollen glands, no vomiting and no wheezing   Risk factors: no hx of PE/DVT       Home Medications Prior to Admission medications   Medication Sig Start Date End Date Taking? Authorizing Provider  HYDROcodone-acetaminophen (NORCO) 5-325 MG tablet Take 1 tablet by mouth every 6 (six) hours as needed. 04/03/17   Cindee SaltKuzma, Gary, MD  losartan (COZAAR) 25 MG tablet Take 25 mg by mouth daily.    [provider]      Allergies    Ampicillin, Erythromycin, and Statins    Review of Systems   Review of Systems  Constitutional:  Negative for chills, diaphoresis, fatigue and fever.  HENT:  Negative for congestion.   Respiratory:  Positive for chest tightness and shortness of breath. Negative for cough, hemoptysis, sputum production and wheezing.   Cardiovascular:  Positive for palpitations. Negative for chest pain and leg swelling.  Gastrointestinal:  Negative for abdominal pain, constipation, diarrhea, nausea and vomiting.  Genitourinary:  Negative for flank pain.  Musculoskeletal:  Negative for back pain, neck pain and neck stiffness.  Skin:  Negative for rash and wound.  Neurological:  Negative for dizziness, light-headedness and  headaches.  Psychiatric/Behavioral:  Negative for agitation and confusion.   All other systems reviewed and are negative.  Physical Exam Updated Vital Signs BP (!) 159/96    Pulse (!) 108    Temp 98.1 F (36.7 C) (Oral)    Resp 16    SpO2 96%  Physical Exam Vitals and nursing note reviewed.  Constitutional:      General: She is not in acute distress.    Appearance: She is well-developed. She is not ill-appearing, toxic-appearing or diaphoretic.  HENT:     Head: Normocephalic and atraumatic.     Mouth/Throat:     Mouth: Mucous membranes are moist.  Eyes:     Conjunctiva/sclera: Conjunctivae normal.     Pupils: Pupils are equal, round, and reactive to light.  Cardiovascular:     Rate and Rhythm: Regular rhythm. Tachycardia present.     Heart sounds: No murmur heard. Pulmonary:     Effort: Pulmonary effort is normal. No respiratory distress.     Breath sounds: Normal breath sounds. No wheezing, rhonchi or rales.  Chest:     Chest wall: No tenderness.  Abdominal:     General: Abdomen is flat.     Palpations: Abdomen is soft.     Tenderness: There is no abdominal tenderness. There is no right CVA tenderness, left CVA tenderness, guarding or rebound.  Musculoskeletal:        General: No swelling or tenderness.     Cervical back: Neck supple. No tenderness.     Right lower leg: No edema.  Left lower leg: No edema.  Skin:    General: Skin is warm and dry.     Capillary Refill: Capillary refill takes less than 2 seconds.     Findings: No erythema.  Neurological:     General: No focal deficit present.     Mental Status: She is alert.     Sensory: No sensory deficit.     Motor: No weakness.  Psychiatric:        Mood and Affect: Mood normal.    ED Results / Procedures / Treatments   Labs (all labs ordered are listed, but only abnormal results are displayed) Labs Reviewed  COMPREHENSIVE METABOLIC PANEL - Abnormal; Notable for the following components:      Result Value    Glucose, Bld 142 (*)    Creatinine, Ser 1.07 (*)    GFR, Estimated 56 (*)    All other components within normal limits  TROPONIN I (HIGH SENSITIVITY) - Abnormal; Notable for the following components:   Troponin I (High Sensitivity) 39 (*)    All other components within normal limits  TROPONIN I (HIGH SENSITIVITY) - Abnormal; Notable for the following components:   Troponin I (High Sensitivity) 66 (*)    All other components within normal limits  RESP PANEL BY RT-PCR (FLU A&B, COVID) ARPGX2  CBC WITH DIFFERENTIAL/PLATELET  BRAIN NATRIURETIC PEPTIDE  HEPARIN LEVEL (UNFRACTIONATED)  CBC    EKG EKG Interpretation  Date/Time:  Friday September 02 2021 14:55:02 EST Ventricular Rate:  119 PR Interval:  168 QRS Duration: 86 QT Interval:  322 QTC Calculation: 452 R Axis:   18 Text Interpretation: Sinus tachycardia Biatrial enlargement Nonspecific ST and T wave abnormality Abnormal ECG When compared to prior, faster rate. No STEMI Confirmed by Theda Belfast ([id number]) on 09/02/2021 6:07:26 PM  Radiology DG Chest 2 View  Result Date: 09/02/2021 CLINICAL DATA:  Tachycardia. EXAM: CHEST - 2 VIEW COMPARISON:  Chest x-ray 12/20/2007. FINDINGS: The heart size and mediastinal contours are within normal limits. Both lungs are clear. The visualized skeletal structures are unremarkable. IMPRESSION: No active cardiopulmonary disease. Electronically Signed   By: Darliss Cheney M.D.   On: 09/02/2021 15:57   CT Angio Chest PE W and/or Wo Contrast  Result Date: 09/02/2021 CLINICAL DATA:  Shortness of breath and tachycardia EXAM: CT ANGIOGRAPHY CHEST WITH CONTRAST TECHNIQUE: Multidetector CT imaging of the chest was performed using the standard protocol during bolus administration of intravenous contrast. Multiplanar CT image reconstructions and MIPs were obtained to evaluate the vascular anatomy. RADIATION DOSE REDUCTION: This exam was performed according to the departmental dose-optimization program which  includes automated exposure control, adjustment of the mA and/or kV according to patient size and/or use of iterative reconstruction technique. CONTRAST:  25mL OMNIPAQUE IOHEXOL 350 MG/ML SOLN COMPARISON:  Chest x-ray 09/02/2021 FINDINGS: Cardiovascular: Satisfactory opacification of the pulmonary arteries to the segmental level. Positive for acute thrombus within the right upper lobar and segmental vessels. Acute thrombus within right middle lobe segmental and subsegmental vessels with thrombus present in the right descending pulmonary artery and right lower lobe segmental and subsegmental vessels. Acute thrombus present within left upper lobe subsegmental vessels, and multiple left lower lobe segmental and subsegmental vessels. Elevated RV LV ratio of 1.18. There appears to be hypertrophy of the left ventricle. Normal cardiac size. No pericardial effusion. Nonaneurysmal aorta. Mediastinum/Nodes: Midline trachea. No thyroid mass. No suspicious lymph nodes. Esophagus within normal limits Lungs/Pleura: No consolidation, pleural effusion or pneumothorax. Mild mosaic density which may be due  to diffuse atelectasis, small airways disease or potential mosaic perfusion given bilateral PE. Upper Abdomen: No acute abnormality.  Splenic granuloma. Musculoskeletal: No chest wall abnormality. No acute or significant osseous findings. Review of the MIP images confirms the above findings. IMPRESSION: Positive for acute right greater than left pulmonary emboli. Positive for acute PE with CT evidence of right heart strain (RV/LV Ratio = 1.18) consistent with at least submassive (intermediate risk) PE. The presence of right heart strain has been associated with an increased risk of morbidity and mortality. Please refer to the "PE Focused" order set in EPIC. Negative for acute airspace disease. Critical Value/emergent results were called by telephone at the time of interpretation on 09/02/2021 at 7:56 pm to provider [facility] , who verbally acknowledged these results. Electronically Signed   By: Jasmine Pang M.D.   On: 09/02/2021 19:56    Procedures Procedures    CRITICAL CARE Performed by: Canary Brim Jude Naclerio Total critical care time: 35 minutes Critical care time was exclusive of separately billable procedures and treating other patients. Critical care was necessary to treat or prevent imminent or life-threatening deterioration. Critical care was time spent personally by me on the following activities: development of treatment plan with patient and/or surrogate as well as nursing, discussions with consultants, evaluation of patient's response to treatment, examination of patient, obtaining history from patient or surrogate, ordering and performing treatments and interventions, ordering and review of laboratory studies, ordering and review of radiographic studies, pulse oximetry and re-evaluation of patient's condition.   Medications Ordered in ED Medications  heparin ADULT infusion 100 units/mL (25000 units/248mL) (1,200 Units/hr Intravenous New Bag/Given 09/02/21 2135)  iohexol (OMNIPAQUE) 350 MG/ML injection 65 mL (65 mLs Intravenous Contrast Given 09/02/21 1930)  heparin bolus via infusion 4,900 Units (4,900 Units Intravenous Bolus from Bag 09/02/21 2135)    ED Course/ Medical Decision Making/ A&P                           Medical Decision Making Amount and/or Complexity of Data Reviewed Labs: ordered. Radiology: ordered.  Risk Prescription drug management. Decision regarding hospitalization.   Samantha Steele is a 71 y.o. female with a past medical history significant for hypertension and chronic shoulder pains who presents with new exertional shortness of breath and tachycardia.  According to patient, she started a walk this afternoon and got very winded and short of breath.  This is new when she is never had this before.  She reports that normally she is able to walk several blocks without  any difficulty with ambulation and felt like she could not even get walk a single block.  She denies chest pain but reports some shortness of breath and tightness.  She denies any recent fevers, chills congestion, or cough.  She denies any chest pain, nausea, vomiting, diaphoresis, lightheadedness, or syncope.  She denies any recent injuries.  She denies leg pain or leg swelling and has no history of DVT or PE.  She does report she has had some left shoulder discomfort which she is had chronically for months and is seeing her orthopedist upcoming to evaluate further.  She denies any new head injury or neck pain.  No back pain reported.  On arrival, heart rate is in the 120s.  Oxygen saturations are being maintained in the 90s on room air and she is not febrile.  She is slightly tachypneic on my evaluation.  On exam, lungs were clear and chest  was nontender.  No murmur.  Good pulses in upper and lower extremities.  No significant edema seen in the legs.  Patient resting comfortably but heart rate remains in the 120 range during my evaluation.  Her blood pressure was also up and down and in the 170s on my evaluation.   EKG was reviewed and shows sinus tachycardia with no STEMI.  Clinically I am concerned about a possible cardiac cause of her exertional shortness of breath versus pulmonary embolism.  Patient had initial troponin in triage and was elevated at 36.  Subsequent was in the 60s.  Due to the high concern for PE given the sudden onset tachycardia and shortness of breath, we will get a CT PE study to rule out thromboembolic cause however if this is negative, we will call cardiology to discuss admission for possible NSTEMI.  Anticipate admission after work-up is completed.  We will get a COVID swab and will also get a BNP given the rising troponin and a reported family history of heart failure troubles.  CT revealed bilateral pulmonary emboli with heart strain.  Second troponin is rising.  Critical  care will come see patient and given the heart strain to make sure she is not a candidate for intervention.  Heparin ordered and patient will be admitted.  Awaiting evaluation by critical care to see if she needs ICU or medicine to admit.  10:22 PM Critical care saw patient and feels she is not at ICU level care at this time.  Do not feel she needs intervention aside from echo, ultrasounds to look for DVTs, and further monitoring.  They left a note and will follow along if needed but felt patient did not need ICU.  Will call medicine for admission.        Final Clinical Impression(s) / ED Diagnoses Final diagnoses:  Acute pulmonary embolism, unspecified pulmonary embolism type, unspecified whether acute cor pulmonale present (HCC)   Clinical Impression: 1. Acute pulmonary embolism, unspecified pulmonary embolism type, unspecified whether acute cor pulmonale present (HCC)   2. Tachycardia     Disposition: Admit  This note was prepared with assistance of Dragon voice recognition software. Occasional wrong-word or sound-a-like substitutions may have occurred due to the inherent limitations of voice recognition software.      Leyanna Bittman, Canary Brimhristopher J, MD 09/02/21 [phone]

## 2021-09-02 NOTE — Progress Notes (Signed)
ANTICOAGULATION CONSULT NOTE - Initial Consult  Pharmacy Consult for heparin Indication: pulmonary embolus  Allergies  Allergen Reactions   Ampicillin Rash   Erythromycin Hives   Statins Other (See Comments)    Foggy head   Alendronate Sodium     Other reaction(s): jaw pain   Venlafaxine     Other reaction(s): non-therpeutic    Patient Measurements:   Heparin Dosing Weight: TBW  Vital Signs: Temp: 98.1 F (36.7 C) (01/20 1714) Temp Source: Oral (01/20 1714) BP: 185/93 (01/20 2045) Pulse Rate: 84 (01/20 2045)  Labs: Recent Labs    09/02/21 1529 09/02/21 1706  HGB 15.0  --   HCT 45.8  --   PLT 199  --   CREATININE 1.07*  --   TROPONINIHS 39* 66*    CrCl cannot be calculated (Unknown ideal weight.).   Medical History: Past Medical History:  Diagnosis Date   Hypertension    Osteopenia     Assessment: 42 YOF presenting with SOB and tachycardia, CT angio with PE and + RHS, she is not on anticoagulation PTA, CBC wnl  Goal of Therapy:  Heparin level 0.3-0.7 units/ml Monitor platelets by anticoagulation protocol: Yes   Plan:  Heparin 4900 units IV x 1, and gtt at 1200 units/hr F/u 6 hour heparin level  Daylene Posey, PharmD Clinical Pharmacist ED Pharmacist Phone # [phone] 09/02/2021 9:18 PM

## 2021-09-02 NOTE — Assessment & Plan Note (Signed)
Stable.  Continue losartan. 

## 2021-09-02 NOTE — Consult Note (Addendum)
NAME:  Samantha Steele, MRN:  [id number], DOB:  [birth year], LOS: 1 ADMISSION DATE:  09/02/2021, CONSULTATION DATE:  09/02/20 REFERRING MD:  Theda Belfast, CHIEF COMPLAINT:  shortness of breath  History of Present Illness:  71 yo woman with a hx of HTN, HLD, here with shortness of breath.  She was been very fatigued with exertion for about a week, much worse today.    Dx with PE, RV: LV 1.18.  Trop slightly elevated and increasing mildly.    Pertinent  Medical History  HTN  HLD  Significant Hospital Events: Including procedures, antibiotic start and stop dates in addition to other pertinent events     Interim History / Subjective:   Objective   Blood pressure (!) 157/72, pulse 76, temperature 97.9 F (36.6 C), temperature source Oral, resp. rate 20, height 5' 3.5" (1.613 m), weight 66.2 kg, SpO2 96 %.        Intake/Output Summary (Last 24 hours) at 09/05/2021 0230 Last data filed at 09/04/2021 0815 Gross per 24 hour  Intake 569.17 ml  Output --  Net 569.17 ml   Filed Weights   09/02/21 2115  Weight: 66.2 kg    Examination: General: NAD HENT: NCAt Lungs: CTAB Cardiovascular: tachycardia Abdomen: Nt, ND Extremities: no edema no erythema Neuro: non focal    Resolved Hospital Problem list     Assessment & Plan:    New diagnosis of PE, CT read: Right right upper lobar and segmental vessels. Acute thrombus within right middle lobe segmental and subsegmental vessels with thrombus presenting the right descending pulmonary artery and right lower lobe segmental and subsegmental vessels.  Acute thrombus present within left upper lobe subsegmental vessels, and multiple left lower lobe segmental and subsegmental vessels.  HR initially elevated 122, now 98.  HTN (170s now, usually 130s-150s) on losartan.    PESI score puts her in low risk category, moderate risk if consider initial tachycardia.  Trop mildly elevated.  RV/LV ratio mildly elevated but otherwise stable  vitals, no O2 needs, no ongoing symptoms.  I think most reasonable to admit overnight, check formal echo and consider DVT work up.   No need for ICU admission at this time.  Cont anticoagulation.     Labs   CBC: Recent Labs  Lab 09/02/21 1529 09/03/21 0335 09/04/21 0040  WBC 8.0 7.9 6.2  NEUTROABS 5.6 4.5  --   HGB 15.0 14.3 13.9  HCT 45.8 44.8 42.3  MCV 90.7 91.4 91.2  PLT 199 204 143*    Basic Metabolic Panel: Recent Labs  Lab 09/02/21 1529 09/03/21 0335  NA 140 140  K 3.7 4.0  CL 108 108  CO2 22 23  GLUCOSE 142* 102*  BUN 17 14  CREATININE 1.07* 0.98  CALCIUM 9.7 9.2   GFR: Estimated Creatinine Clearance: 48.7 mL/min (by C-G formula based on SCr of 0.98 mg/dL). Recent Labs  Lab 09/02/21 1529 09/03/21 0335 09/04/21 0040  WBC 8.0 7.9 6.2    Liver Function Tests: Recent Labs  Lab 09/02/21 1529 09/03/21 0335  AST 23 16  ALT 22 17  ALKPHOS 60 58  BILITOT 0.8 0.8  PROT 7.0 6.5  ALBUMIN 4.2 3.9   No results for input(s): LIPASE, AMYLASE in the last 168 hours. No results for input(s): AMMONIA in the last 168 hours.  ABG No results found for: PHART, PCO2ART, PO2ART, HCO3, TCO2, ACIDBASEDEF, O2SAT   Coagulation Profile: No results for input(s): INR, PROTIME in the last 168 hours.  Cardiac Enzymes: No  results for input(s): CKTOTAL, CKMB, CKMBINDEX, TROPONINI in the last 168 hours.  HbA1C: No results found for: HGBA1C  CBG: No results for input(s): GLUCAP in the last 168 hours.  Review of Systems:   Review of Systems  Constitutional:  Negative for chills and fever.  HENT:  Negative for hearing loss.   Eyes:  Negative for blurred vision.  Respiratory:  Positive for cough.   Cardiovascular:  Negative for chest pain.  Gastrointestinal:  Negative for heartburn.  Genitourinary:  Negative for dysuria.  Musculoskeletal:  Negative for myalgias.  Skin:  Negative for rash.  Neurological:  Negative for dizziness.  Endo/Heme/Allergies:  Does not  bruise/bleed easily.  Psychiatric/Behavioral:  Negative for depression.     Past Medical History:  She,  has a past medical history of Hypertension and Osteopenia.   Surgical History:   Past Surgical History:  Procedure Laterality Date   TRIGGER FINGER RELEASE  2015   TRIGGER FINGER RELEASE Left 11/16/2015   Procedure: RELEASE TRIGGER FINGER/A-1 PULLEY LEFT MIDDLE FINGER;  Surgeon: Cindee Salt, MD;  Location: [facility];  Service: Orthopedics;  Laterality: Left;  middle finger   TRIGGER FINGER RELEASE Right 04/03/2017   Procedure: RELEASE TRIGGER FINGER/A-1 PULLEY RIGHT RING;  Surgeon: Cindee Salt, MD;  Location: [facility];  Service: Orthopedics;  Laterality: Right;     Social History:   reports that she has never smoked. She has never used smokeless tobacco. She reports that she does not drink alcohol and does not use drugs.   Family History:  Her family history includes Stroke in her sister.   Allergies Allergies  Allergen Reactions   Ampicillin Rash   Erythromycin Hives   Statins Other (See Comments)    Foggy head   Alendronate Sodium     Other reaction(s): jaw pain   Venlafaxine     Other reaction(s): non-therpeutic     Home Medications  Prior to Admission medications   Medication Sig Start Date End Date Taking? Authorizing Provider  acetaminophen (TYLENOL) 500 MG tablet Take 500 mg by mouth every 6 (six) hours as needed for moderate pain.   Yes [provider]  calcium carbonate (OSCAL) 1500 (600 Ca) MG TABS tablet Take 1,500 mg by mouth daily.   Yes [provider]  Cholecalciferol (VITAMIN D3) 50 MCG (2000 UT) capsule Take 2,000 Units by mouth daily.   Yes [provider]  estradiol (ESTRACE) 0.1 MG/GM vaginal cream Place 1 Applicatorful vaginally once a week. 04/25/21  Yes [provider]  losartan (COZAAR) 25 MG tablet Take 25 mg by mouth daily.   Yes [provider]  rosuvastatin (CRESTOR) 5  MG tablet Take 2.5 mg by mouth every Monday, Wednesday, and Friday. 05/28/21  Yes [provider]  apixaban (ELIQUIS) 5 MG TABS tablet Take 2 tablets (10 mg) twice daily,and on 09/10/21 switch to 1 tablet (5 mg) twice twice daily. 09/04/21   Ghimire, Werner Lean, MD     Critical care time: 45 min

## 2021-09-02 NOTE — Assessment & Plan Note (Signed)
Continue crestor 

## 2021-09-02 NOTE — Assessment & Plan Note (Addendum)
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  Maintained on IV heparin since admission-will be transitioned to Eliquis today (risk/alternatives/benefits discussed in detail).  She is overall improved-has been ambulating around in the hallway without any major issues. TTE without RV systolic dysfunction.her room without any major issues. 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

## 2021-09-02 NOTE — ED Provider Triage Note (Signed)
Emergency Medicine Provider Triage Evaluation Note  Samantha Steele , a 71 y.o. female  was evaluated in triage.  Pt complains of her heart beating fast and short of breath  Review of Systems  Positive: Short of breath Negative: Fever or cough  Physical Exam  BP (!) 196/94 (BP Location: Right Arm)    Pulse (!) 122    Temp 98.2 F (36.8 C) (Oral)    Resp 17    SpO2 98%  Gen:   Awake, no distress   Resp:  Normal effort  MSK:   Moves extremities without difficulty  Other:    Medical Decision Making  Medically screening exam initiated at 3:22 PM.  Appropriate orders placed.  Alexa Blish was informed that the remainder of the evaluation will be completed by another provider, this initial triage assessment does not replace that evaluation, and the importance of remaining in the ED until their evaluation is complete.     Elson Areas, [place] 09/02/21 1523

## 2021-09-03 ENCOUNTER — Encounter (HOSPITAL_COMMUNITY): Payer: Self-pay | Admitting: Internal Medicine

## 2021-09-03 ENCOUNTER — Observation Stay (HOSPITAL_COMMUNITY): Payer: Medicare PPO

## 2021-09-03 ENCOUNTER — Other Ambulatory Visit: Payer: Self-pay

## 2021-09-03 DIAGNOSIS — I82451 Acute embolism and thrombosis of right peroneal vein: Secondary | ICD-10-CM | POA: Diagnosis present

## 2021-09-03 DIAGNOSIS — R252 Cramp and spasm: Secondary | ICD-10-CM | POA: Diagnosis not present

## 2021-09-03 DIAGNOSIS — I10 Essential (primary) hypertension: Secondary | ICD-10-CM | POA: Diagnosis present

## 2021-09-03 DIAGNOSIS — I2699 Other pulmonary embolism without acute cor pulmonale: Secondary | ICD-10-CM | POA: Diagnosis present

## 2021-09-03 DIAGNOSIS — Z20822 Contact with and (suspected) exposure to covid-19: Secondary | ICD-10-CM | POA: Diagnosis present

## 2021-09-03 DIAGNOSIS — E785 Hyperlipidemia, unspecified: Secondary | ICD-10-CM | POA: Diagnosis present

## 2021-09-03 DIAGNOSIS — Z79899 Other long term (current) drug therapy: Secondary | ICD-10-CM | POA: Diagnosis not present

## 2021-09-03 DIAGNOSIS — M8580 Other specified disorders of bone density and structure, unspecified site: Secondary | ICD-10-CM | POA: Diagnosis present

## 2021-09-03 DIAGNOSIS — Z888 Allergy status to other drugs, medicaments and biological substances status: Secondary | ICD-10-CM | POA: Diagnosis not present

## 2021-09-03 DIAGNOSIS — R0602 Shortness of breath: Secondary | ICD-10-CM | POA: Diagnosis present

## 2021-09-03 DIAGNOSIS — Z880 Allergy status to penicillin: Secondary | ICD-10-CM | POA: Diagnosis not present

## 2021-09-03 DIAGNOSIS — Z79890 Hormone replacement therapy (postmenopausal): Secondary | ICD-10-CM | POA: Diagnosis not present

## 2021-09-03 DIAGNOSIS — Z881 Allergy status to other antibiotic agents status: Secondary | ICD-10-CM | POA: Diagnosis not present

## 2021-09-03 LAB — CBC WITH DIFFERENTIAL/PLATELET
Abs Immature Granulocytes: 0.01 10*3/uL (ref 0.00–0.07)
Basophils Absolute: 0.1 10*3/uL (ref 0.0–0.1)
Basophils Relative: 1 %
Eosinophils Absolute: 0.2 10*3/uL (ref 0.0–0.5)
Eosinophils Relative: 3 %
HCT: 44.8 % (ref 36.0–46.0)
Hemoglobin: 14.3 g/dL (ref 12.0–15.0)
Immature Granulocytes: 0 %
Lymphocytes Relative: 31 %
Lymphs Abs: 2.4 10*3/uL (ref 0.7–4.0)
MCH: 29.2 pg (ref 26.0–34.0)
MCHC: 31.9 g/dL (ref 30.0–36.0)
MCV: 91.4 fL (ref 80.0–100.0)
Monocytes Absolute: 0.7 10*3/uL (ref 0.1–1.0)
Monocytes Relative: 9 %
Neutro Abs: 4.5 10*3/uL (ref 1.7–7.7)
Neutrophils Relative %: 56 %
Platelets: 204 10*3/uL (ref 150–400)
RBC: 4.9 MIL/uL (ref 3.87–5.11)
RDW: 12.4 % (ref 11.5–15.5)
WBC: 7.9 10*3/uL (ref 4.0–10.5)
nRBC: 0 % (ref 0.0–0.2)

## 2021-09-03 LAB — COMPREHENSIVE METABOLIC PANEL
ALT: 17 U/L (ref 0–44)
AST: 16 U/L (ref 15–41)
Albumin: 3.9 g/dL (ref 3.5–5.0)
Alkaline Phosphatase: 58 U/L (ref 38–126)
Anion gap: 9 (ref 5–15)
BUN: 14 mg/dL (ref 8–23)
CO2: 23 mmol/L (ref 22–32)
Calcium: 9.2 mg/dL (ref 8.9–10.3)
Chloride: 108 mmol/L (ref 98–111)
Creatinine, Ser: 0.98 mg/dL (ref 0.44–1.00)
GFR, Estimated: >60 mL/min (ref >60)
Glucose, Bld: 102 mg/dL — ABNORMAL HIGH (ref 70–99)
Potassium: 4 mmol/L (ref 3.5–5.1)
Sodium: 140 mmol/L (ref 135–145)
Total Bilirubin: 0.8 mg/dL (ref 0.3–1.2)
Total Protein: 6.5 g/dL (ref 6.5–8.1)

## 2021-09-03 LAB — HEPARIN LEVEL (UNFRACTIONATED)
Heparin Unfractionated: >1.1 IU/mL — ABNORMAL HIGH (ref 0.30–0.70)
Heparin Unfractionated: 0.54 IU/mL (ref 0.30–0.70)

## 2021-09-03 LAB — TROPONIN I (HIGH SENSITIVITY): Troponin I (High Sensitivity): 87 ng/L — ABNORMAL HIGH (ref <18)

## 2021-09-03 MED ORDER — ACETAMINOPHEN 650 MG RE SUPP: 650.0000 mg | Freq: Four times a day (QID) | RECTAL | Status: DC | PRN

## 2021-09-03 MED ORDER — ONDANSETRON HCL 4 MG/2ML IJ SOLN: 4.0000 mg | Freq: Four times a day (QID) | INTRAMUSCULAR | Status: DC | PRN

## 2021-09-03 MED ORDER — ACETAMINOPHEN 325 MG PO TABS: 650.0000 mg | ORAL_TABLET | Freq: Four times a day (QID) | ORAL | Status: DC | PRN

## 2021-09-03 MED ORDER — HEPARIN (PORCINE) 25000 UT/250ML-% IV SOLN
1000.0000 [IU]/h | INTRAVENOUS | Status: AC
  Administered 2021-09-03 – 2021-09-04 (×2): 1000 [IU]/h via INTRAVENOUS
  Filled 2021-09-03 (×2): qty 250

## 2021-09-03 MED ORDER — LOSARTAN POTASSIUM 50 MG PO TABS
25.0000 mg | ORAL_TABLET | Freq: Every day | ORAL | Status: DC
Start: 2021-09-03 — End: 2021-09-04
  Filled 2021-09-03 (×2): qty 1

## 2021-09-03 MED ORDER — ROSUVASTATIN CALCIUM 5 MG PO TABS: 2.5000 mg | ORAL_TABLET | ORAL | Status: DC

## 2021-09-03 MED ORDER — ONDANSETRON HCL 4 MG PO TABS: 4.0000 mg | ORAL_TABLET | Freq: Four times a day (QID) | ORAL | Status: DC | PRN

## 2021-09-03 MED ORDER — MELATONIN 5 MG PO TABS
10.0000 mg | ORAL_TABLET | Freq: Every evening | ORAL | Status: DC | PRN
  Filled 2021-09-03: qty 2

## 2021-09-03 NOTE — Progress Notes (Signed)
Bilateral lower extremity venous duplex completed. Refer to "CV Proc" under chart review to view preliminary results.  09/03/2021 12:31 PM Kelby Aline., MHA, RVT, RDCS, RDMS

## 2021-09-03 NOTE — ED Notes (Signed)
Patient transported to Vascular 

## 2021-09-03 NOTE — Progress Notes (Signed)
PROGRESS NOTE        PATIENT DETAILS Name: Samantha Steele Age: 71 y.o. Sex: female Date of Birth: 1951-04-15 Admit Date: 09/02/2021 Admitting Physician Kristopher Oppenheim, DO TJ:[id number], Marjory Lies, MD  Brief Narrative: Patient is a 71 y.o. female with history of HTN, HLD-who presented with few days history of shortness of breath with exertion-found to have pulm embolism.  Subjective: Lying comfortably in bed-denies any chest pain or shortness of breath.  Objective: Vitals: Blood pressure (!) 144/77, pulse 94, temperature 98.1 F (36.7 C), temperature source Oral, resp. rate 14, height 5' 3.5" (1.613 m), weight 66.2 kg, SpO2 97 %.   Exam: Gen Exam:Alert awake-not in any distress HEENT:atraumatic, normocephalic Chest: B/L clear to auscultation anteriorly CVS:S1S2 regular Abdomen:soft non tender, non distended Extremities:no edema Neurology: Non focal Skin: no rash  Pertinent Labs/Radiology: CBC Latest Ref Rng & Units 09/03/2021 09/02/2021  WBC 4.0 - 10.5 K/uL 7.9 8.0  Hemoglobin 12.0 - 15.0 g/dL 14.3 15.0  Hematocrit 36.0 - 46.0 % 44.8 45.8  Platelets 150 - 400 K/uL 204 199    Lab Results  Component Value Date   NA 140 09/03/2021   K 4.0 09/03/2021   CL 108 09/03/2021   CO2 23 09/03/2021      Assessment/Plan: * Submassive acute pulmonary embolism.- (present on admission) Although has evidence of RV strain on CTA-she remains  hemodynamically stable on room air-appears to be unprovoked VTE.await echo lower extremity Dopplers-continue IV heparin-we will plan to switch to an oral agent if patient continues to do well over the next 24 hours.  Will probably require indefinite anticoagulation and follow-up with hematology in the outpatient setting.  Essential hypertension- (present on admission) Stable. Continue losartan.  Hyperlipidemia- (present on admission) Continue crestor.   BMI Estimated body mass index is 25.46 kg/m as calculated from the  following:   Height as of this encounter: 5' 3.5" (1.613 m).   Weight as of this encounter: 66.2 kg.    DVT Prophylaxis: IV Heparin Procedures: None Consults: None Code Status:Full code Family Communication: None at bedside   Disposition Plan: Status is: Observation  The patient will require care spanning > 2 midnights and should be moved to inpatient because: Submassive PE-on IV heparin-awaiting work-up-if remains hemodynamically stable-we will switch to oral anticoagulant in the next 12 to 24 hours.  Probably discharge home on 1/22.    Diet: Diet Order             Diet regular Room service appropriate? Yes; Fluid consistency: Thin  Diet effective now                     Antimicrobial agents: Anti-infectives (From admission, onward)    None        MEDICATIONS: Scheduled Meds:  losartan  25 mg Oral Daily   [START ON 09/05/2021] rosuvastatin  2.5 mg Oral Q M,W,F   Continuous Infusions:  heparin 1,000 Units/hr (09/03/21 0552)   PRN Meds:.acetaminophen **OR** acetaminophen, melatonin, ondansetron **OR** ondansetron (ZOFRAN) IV   I have personally reviewed following labs and imaging studies  LABORATORY DATA: CBC: Recent Labs  Lab 09/02/21 1529 09/03/21 0335  WBC 8.0 7.9  NEUTROABS 5.6 4.5  HGB 15.0 14.3  HCT 45.8 44.8  MCV 90.7 91.4  PLT 199 0000000    Basic Metabolic Panel: Recent Labs  Lab 09/02/21 1529 09/03/21 0335  NA 140 140  K 3.7 4.0  CL 108 108  CO2 22 23  GLUCOSE 142* 102*  BUN 17 14  CREATININE 1.07* 0.98  CALCIUM 9.7 9.2    GFR: Estimated Creatinine Clearance: 48.7 mL/min (by C-G formula based on SCr of 0.98 mg/dL).  Liver Function Tests: Recent Labs  Lab 09/02/21 1529 09/03/21 0335  AST 23 16  ALT 22 17  ALKPHOS 60 58  BILITOT 0.8 0.8  PROT 7.0 6.5  ALBUMIN 4.2 3.9   No results for input(s): LIPASE, AMYLASE in the last 168 hours. No results for input(s): AMMONIA in the last 168 hours.  Coagulation Profile: No  results for input(s): INR, PROTIME in the last 168 hours.  Cardiac Enzymes: No results for input(s): CKTOTAL, CKMB, CKMBINDEX, TROPONINI in the last 168 hours.  BNP (last 3 results) No results for input(s): PROBNP in the last 8760 hours.  Lipid Profile: No results for input(s): CHOL, HDL, LDLCALC, TRIG, CHOLHDL, LDLDIRECT in the last 72 hours.  Thyroid Function Tests: No results for input(s): TSH, T4TOTAL, FREET4, T3FREE, THYROIDAB in the last 72 hours.  Anemia Panel: No results for input(s): VITAMINB12, FOLATE, FERRITIN, TIBC, IRON, RETICCTPCT in the last 72 hours.  Urine analysis: No results found for: COLORURINE, APPEARANCEUR, LABSPEC, PHURINE, GLUCOSEU, HGBUR, BILIRUBINUR, KETONESUR, PROTEINUR, UROBILINOGEN, NITRITE, LEUKOCYTESUR  Sepsis Labs: Lactic Acid, Venous No results found for: LATICACIDVEN  MICROBIOLOGY: Recent Results (from the past 240 hour(s))  Resp Panel by RT-PCR (Flu A&B, Covid) Nasopharyngeal Swab     Status: None   Collection Time: 09/02/21  2:50 PM   Specimen: Nasopharyngeal Swab; Nasopharyngeal(NP) swabs in vial transport medium  Result Value Ref Range Status   SARS Coronavirus 2 by RT PCR NEGATIVE NEGATIVE Final    Comment: (NOTE) SARS-CoV-2 target nucleic acids are NOT DETECTED.  The SARS-CoV-2 RNA is generally detectable in upper respiratory specimens during the acute phase of infection. The lowest concentration of SARS-CoV-2 viral copies this assay can detect is 138 copies/mL. A negative result does not preclude SARS-Cov-2 infection and should not be used as the sole basis for treatment or other patient management decisions. A negative result may occur with  improper specimen collection/handling, submission of specimen other than nasopharyngeal swab, presence of viral mutation(s) within the areas targeted by this assay, and inadequate number of viral copies(<138 copies/mL). A negative result must be combined with clinical observations, patient  history, and epidemiological information. The expected result is Negative.  Fact Sheet for Patients:  EntrepreneurPulse.com.au  Fact Sheet for Healthcare Providers:  IncredibleEmployment.be  This test is no t yet approved or cleared by the [place] FDA and  has been authorized for detection and/or diagnosis of SARS-CoV-2 by FDA under an Emergency Use Authorization (EUA). This EUA will remain  in effect (meaning this test can be used) for the duration of the COVID-19 declaration under Section 564(b)(1) of the Act, 21 U.S.C.section 360bbb-3(b)(1), unless the authorization is terminated  or revoked sooner.       Influenza A by PCR NEGATIVE NEGATIVE Final   Influenza B by PCR NEGATIVE NEGATIVE Final    Comment: (NOTE) The Xpert Xpress SARS-CoV-2/FLU/RSV plus assay is intended as an aid in the diagnosis of influenza from Nasopharyngeal swab specimens and should not be used as a sole basis for treatment. Nasal washings and aspirates are unacceptable for Xpert Xpress SARS-CoV-2/FLU/RSV testing.  Fact Sheet for Patients: EntrepreneurPulse.com.au  Fact Sheet for Healthcare Providers: IncredibleEmployment.be  This test is not yet approved or cleared by  the [employer] and has been authorized for detection and/or diagnosis of SARS-CoV-2 by FDA under an Emergency Use Authorization (EUA). This EUA will remain in effect (meaning this test can be used) for the duration of the COVID-19 declaration under Section 564(b)(1) of the Act, 21 U.S.C. section 360bbb-3(b)(1), unless the authorization is terminated or revoked.  Performed at [facility], [place] [address], [place], [place] [address]     RADIOLOGY STUDIES/RESULTS: DG Chest 2 View  Result Date: 09/02/2021 CLINICAL DATA:  Tachycardia. EXAM: CHEST - 2 VIEW COMPARISON:  Chest x-ray 12/20/2007. FINDINGS: The heart size and mediastinal contours are  within normal limits. Both lungs are clear. The visualized skeletal structures are unremarkable. IMPRESSION: No active cardiopulmonary disease. Electronically Signed   By: Ronney Asters M.D.   On: 09/02/2021 15:57   CT Angio Chest PE W and/or Wo Contrast  Result Date: 09/02/2021 CLINICAL DATA:  Shortness of breath and tachycardia EXAM: CT ANGIOGRAPHY CHEST WITH CONTRAST TECHNIQUE: Multidetector CT imaging of the chest was performed using the standard protocol during bolus administration of intravenous contrast. Multiplanar CT image reconstructions and MIPs were obtained to evaluate the vascular anatomy. RADIATION DOSE REDUCTION: This exam was performed according to the departmental dose-optimization program which includes automated exposure control, adjustment of the mA and/or kV according to patient size and/or use of iterative reconstruction technique. CONTRAST:  90mL OMNIPAQUE IOHEXOL 350 MG/ML SOLN COMPARISON:  Chest x-ray 09/02/2021 FINDINGS: Cardiovascular: Satisfactory opacification of the pulmonary arteries to the segmental level. Positive for acute thrombus within the right upper lobar and segmental vessels. Acute thrombus within right middle lobe segmental and subsegmental vessels with thrombus present in the right descending pulmonary artery and right lower lobe segmental and subsegmental vessels. Acute thrombus present within left upper lobe subsegmental vessels, and multiple left lower lobe segmental and subsegmental vessels. Elevated RV LV ratio of 1.18. There appears to be hypertrophy of the left ventricle. Normal cardiac size. No pericardial effusion. Nonaneurysmal aorta. Mediastinum/Nodes: Midline trachea. No thyroid mass. No suspicious lymph nodes. Esophagus within normal limits Lungs/Pleura: No consolidation, pleural effusion or pneumothorax. Mild mosaic density which may be due to diffuse atelectasis, small airways disease or potential mosaic perfusion given bilateral PE. Upper Abdomen: No  acute abnormality.  Splenic granuloma. Musculoskeletal: No chest wall abnormality. No acute or significant osseous findings. Review of the MIP images confirms the above findings. IMPRESSION: Positive for acute right greater than left pulmonary emboli. Positive for acute PE with CT evidence of right heart strain (RV/LV Ratio = 1.18) consistent with at least submassive (intermediate risk) PE. The presence of right heart strain has been associated with an increased risk of morbidity and mortality. Please refer to the "PE Focused" order set in EPIC. Negative for acute airspace disease. Critical Value/emergent results were called by telephone at the time of interpretation on 09/02/2021 at 7:56 pm to provider [facility] , who verbally acknowledged these results. Electronically Signed   By: Donavan Foil M.D.   On: 09/02/2021 19:56     LOS: 0 days   Oren Binet, MD  [facility]    To contact the attending provider between 7A-7P or the covering provider during after hours 7P-7A, please log into the web site [email or web address] and access using universal [place] password for that web site. If you do not have the password, please call the hospital operator.  09/03/2021, 11:10 AM

## 2021-09-03 NOTE — Progress Notes (Signed)
ANTICOAGULATION CONSULT NOTE - Initial Consult  Pharmacy Consult for heparin Indication: pulmonary embolus  Allergies  Allergen Reactions   Ampicillin Rash   Erythromycin Hives   Statins Other (See Comments)    Foggy head   Alendronate Sodium     Other reaction(s): jaw pain   Venlafaxine     Other reaction(s): non-therpeutic    Patient Measurements: Height: 5' 3.5" (161.3 cm) Weight: 66.2 kg (146 lb) IBW/kg (Calculated) : 53.55 Heparin Dosing Weight: TBW  Vital Signs: Temp: 98.3 F (36.8 C) (01/21 1400) BP: 140/70 (01/21 1400) Pulse Rate: 78 (01/21 1400)  Labs: Recent Labs    09/02/21 1529 09/02/21 1706 09/03/21 0334 09/03/21 0335 09/03/21 1400  HGB 15.0  --   --  14.3  --   HCT 45.8  --   --  44.8  --   PLT 199  --   --  204  --   HEPARINUNFRC  --   --  >1.10*  --  0.54  CREATININE 1.07*  --   --  0.98  --   TROPONINIHS 39* 66*  --  87*  --      Estimated Creatinine Clearance: 48.Samantha mL/min (by C-G formula based on SCr of 0.98 mg/dL).   Medical History: Past Medical History:  Diagnosis Date   Hypertension    Osteopenia     Assessment: Samantha Steele presenting with SOB and tachycardia, CT angio with PE and + RHS, she is not on anticoagulation PTA, CBC wnl  Heparin level therapeutic s/p rate adjustment to 1000 units/hr  Goal of Therapy:  Heparin level 0.3-0.Samantha units/ml Monitor platelets by anticoagulation protocol: Yes   Plan:  Continue heparin gtt at 1000 units/hr Daily heparin level, CBC, s/s bleeding F/u long term AC plan and ability to transition to PO  Daylene Posey, PharmD Clinical Pharmacist ED Pharmacist Phone # [phone] 09/03/2021 2:44 PM

## 2021-09-03 NOTE — Plan of Care (Signed)

## 2021-09-03 NOTE — Progress Notes (Signed)
ANTICOAGULATION CONSULT NOTE   Pharmacy Consult for heparin Indication: pulmonary embolus  Allergies  Allergen Reactions   Ampicillin Rash   Erythromycin Hives   Statins Other (See Comments)    Foggy head   Alendronate Sodium     Other reaction(s): jaw pain   Venlafaxine     Other reaction(s): non-therpeutic    Patient Measurements: Height: 5' 3.5" (161.3 cm) Weight: 66.2 kg (146 lb) IBW/kg (Calculated) : 53.55 Heparin Dosing Weight: TBW  Vital Signs: Temp: 98.1 F (36.7 C) (01/20 1714) Temp Source: Oral (01/20 1714) BP: 142/82 (01/21 0350) Pulse Rate: 75 (01/21 0350)  Labs: Recent Labs    09/02/21 1529 09/02/21 1706 09/03/21 0334 09/03/21 0335  HGB 15.0  --   --  14.3  HCT 45.8  --   --  44.8  PLT 199  --   --  204  HEPARINUNFRC  --   --  >1.10*  --   CREATININE 1.07*  --   --  0.98  TROPONINIHS 39* 66*  --   --      Estimated Creatinine Clearance: 48.7 mL/min (by C-G formula based on SCr of 0.98 mg/dL).   Medical History: Past Medical History:  Diagnosis Date   Hypertension    Osteopenia     Assessment: 34 YOF presenting with SOB and tachycardia, CT angio with PE and + RHS, she is not on anticoagulation PTA, CBC wnl  Initial HL is > 1.1 on 1200 units/hr of IV heparin. Per RN, lab was drawn from R arm and heparin is infusing in left arm. No s/s of bleeding noted. H/H and Plt wnl. SCr wnl   Goal of Therapy:  Heparin level 0.3-0.7 units/ml Monitor platelets by anticoagulation protocol: Yes   Plan:  -Instructed RN to hold heparin infusion x 1 hour. Will restart heparin at 1000 units/hr -F/u 8 hr HL post restart -Monitor closely for s/s of bleeding   Vinnie Level, PharmD., BCPS, BCCCP Clinical Pharmacist Please refer to [facility] for unit-specific pharmacist

## 2021-09-04 ENCOUNTER — Inpatient Hospital Stay (HOSPITAL_COMMUNITY): Payer: Medicare PPO

## 2021-09-04 ENCOUNTER — Other Ambulatory Visit (HOSPITAL_COMMUNITY): Payer: Medicare PPO

## 2021-09-04 DIAGNOSIS — I2699 Other pulmonary embolism without acute cor pulmonale: Secondary | ICD-10-CM

## 2021-09-04 DIAGNOSIS — R0602 Shortness of breath: Secondary | ICD-10-CM

## 2021-09-04 LAB — ECHOCARDIOGRAM COMPLETE
AR max vel: 2.43 cm2
AV Area VTI: 2.7 cm2
AV Area mean vel: 2.73 cm2
AV Mean grad: 5 mmHg
AV Peak grad: 8.6 mmHg
Ao pk vel: 1.47 m/s
Area-P 1/2: 4.29 cm2
Height: 63.5 in
S' Lateral: 2.2 cm
Weight: 2336 oz

## 2021-09-04 LAB — CBC
HCT: 42.3 % (ref 36.0–46.0)
Hemoglobin: 13.9 g/dL (ref 12.0–15.0)
MCH: 30 pg (ref 26.0–34.0)
MCHC: 32.9 g/dL (ref 30.0–36.0)
MCV: 91.2 fL (ref 80.0–100.0)
Platelets: 143 10*3/uL — ABNORMAL LOW (ref 150–400)
RBC: 4.64 MIL/uL (ref 3.87–5.11)
RDW: 12.4 % (ref 11.5–15.5)
WBC: 6.2 10*3/uL (ref 4.0–10.5)
nRBC: 0 % (ref 0.0–0.2)

## 2021-09-04 LAB — HEPARIN LEVEL (UNFRACTIONATED): Heparin Unfractionated: 0.7 IU/mL (ref 0.30–0.70)

## 2021-09-04 MED ORDER — APIXABAN 5 MG PO TABS: 5.0000 mg | ORAL_TABLET | Freq: Two times a day (BID) | ORAL | Status: DC

## 2021-09-04 MED ORDER — APIXABAN 5 MG PO TABS: ORAL_TABLET | ORAL | 3 refills | Status: DC

## 2021-09-04 MED ORDER — APIXABAN 5 MG PO TABS
10.0000 mg | ORAL_TABLET | Freq: Two times a day (BID) | ORAL | Status: DC
  Administered 2021-09-04: 10 mg via ORAL
  Filled 2021-09-04: qty 2

## 2021-09-04 NOTE — Discharge Summary (Signed)
PATIENT DETAILS Name: Samantha Steele Age: 71 y.o. Sex: female Date of Birth: 1951-04-23 MRN: [id number]. Admitting Physician: Carollee Herter, DO [id number], Clifton Custard, MD  Admit Date: 09/02/2021 Discharge date: 09/04/2021  Recommendations for Outpatient Follow-up:  Follow up with PCP in 1-2 weeks Please obtain CMP/CBC in one week Please ensure follow-up with hematology/oncology. New medication-Eliquis-will likely require indefinite therapy.  Admitted From:  Home   Disposition: Home   Home Health: Yes  Equipment/Devices: None  Discharge Condition: Stable  CODE STATUS: FULL CODE  Diet recommendation:  Diet Order             Diet - low sodium heart healthy           Diet regular Room service appropriate? Yes; Fluid consistency: Thin  Diet effective now                    Brief Summary: Patient is a 71 y.o. female with history of HTN, HLD-who presented with few days history of shortness of breath with exertion-found to have pulm embolism  Brief Hospital Course: * Submassive acute pulmonary embolism with acute DVT involving the right peroneal vein- (present on admission) Although has evidence of RV strain on CTA-she remains  hemodynamically stable on room air-appears to be unprovoked VTE.  Maintained on IV heparin since admission-will be transitioned to Eliquis today (risk/alternatives/benefits discussed in detail).  She is overall improved-has been ambulating around in the hallway without any major issues. TTE without RV systolic dysfunction.her room without any major issues. Awaiting TEE.She will also need outpatient hematology referral-I will send an epic referral.Given that this is unprovoked VTE-suspect will require indefinite anticoagulation    Essential hypertension- (present on admission) Stable. Continue losartan.  Hyperlipidemia- (present on admission) Continue crestor.  BMI: Estimated body mass index is 25.46 kg/m as calculated from the following:    Height as of this encounter: 5' 3.5" (1.613 m).   Weight as of this encounter: 66.2 kg.     Procedures None  Discharge Diagnoses:  Principal Problem:   Submassive acute pulmonary embolism with acute DVT involving the right peroneal vein Active Problems:   Essential hypertension   Hyperlipidemia   Discharge Instructions:  Activity:  As tolerated   Discharge Instructions     Ambulatory referral to Hematology / Oncology   Complete by: As directed    Submassive unprovoked venous thromboembolism with PE-needs outpatient hypercoagulable work-up and hematology evaluation.   Call MD for:  difficulty breathing, headache or visual disturbances   Complete by: As directed    Diet - low sodium heart healthy   Complete by: As directed    Discharge instructions   Complete by: As directed    Follow with Primary MD  Farris Has, MD in 1-2 weeks  You will get a call from cancer center for appointment-if you do not hear from them-please give them a call.  Please get a complete blood count and chemistry panel checked by your Primary MD at your next visit, and again as instructed by your Primary MD.  Get Medicines reviewed and adjusted: Please take all your medications with you for your next visit with your Primary MD  Laboratory/radiological data: Please request your Primary MD to go over all hospital tests and procedure/radiological results at the follow up, please ask your Primary MD to get all Hospital records sent to his/her office.  In some cases, they will be blood work, cultures and biopsy results pending at the time of your discharge. Please request  that your primary care M.D. follows up on these results.  Also Note the following: If you experience worsening of your admission symptoms, develop shortness of breath, life threatening emergency, suicidal or homicidal thoughts you must seek medical attention immediately by calling 911 or calling your MD immediately  if symptoms less  severe.  You must read complete instructions/literature along with all the possible adverse reactions/side effects for all the Medicines you take and that have been prescribed to you. Take any new Medicines after you have completely understood and accpet all the possible adverse reactions/side effects.   Do not drive when taking Pain medications or sleeping medications (Benzodaizepines)  Do not take more than prescribed Pain, Sleep and Anxiety Medications. It is not advisable to combine anxiety,sleep and pain medications without talking with your primary care practitioner  Special Instructions: If you have smoked or chewed Tobacco  in the last 2 yrs please stop smoking, stop any regular Alcohol  and or any Recreational drug use.  Wear Seat belts while driving.  Please note: You were cared for by a hospitalist during your hospital stay. Once you are discharged, your primary care physician will handle any further medical issues. Please note that NO REFILLS for any discharge medications will be authorized once you are discharged, as it is imperative that you return to your primary care physician (or establish a relationship with a primary care physician if you do not have one) for your post hospital discharge needs so that they can reassess your need for medications and monitor your lab values.   Increase activity slowly   Complete by: As directed       Allergies as of 09/04/2021       Reactions   Ampicillin Rash   Erythromycin Hives   Statins Other (See Comments)   Foggy head   Alendronate Sodium    Other reaction(s): jaw pain   Venlafaxine    Other reaction(s): non-therpeutic        Medication List     STOP taking these medications    HYDROcodone-acetaminophen 5-325 MG tablet Commonly known as: Norco       TAKE these medications    acetaminophen 500 MG tablet Commonly known as: TYLENOL Take 500 mg by mouth every 6 (six) hours as needed for moderate pain.   apixaban 5  MG Tabs tablet Commonly known as: ELIQUIS Take 2 tablets (10 mg) twice daily,and on 09/10/21 switch to 1 tablet (5 mg) twice twice daily.   calcium carbonate 1500 (600 Ca) MG Tabs tablet Commonly known as: OSCAL Take 1,500 mg by mouth daily.   estradiol 0.1 MG/GM vaginal cream Commonly known as: ESTRACE Place 1 Applicatorful vaginally once a week.   losartan 25 MG tablet Commonly known as: COZAAR Take 25 mg by mouth daily.   rosuvastatin 5 MG tablet Commonly known as: CRESTOR Take 2.5 mg by mouth every Monday, Wednesday, and Friday.   Vitamin D3 50 MCG (2000 UT) capsule Take 2,000 Units by mouth daily.        Follow-up Information     Farris Has, MD. Schedule an appointment as soon as possible for a visit in 1 week(s).   Specialty: Family Medicine Contact information: [address] [place] [address] [phone]         [facility] Follow up.   Specialty: Oncology Why: Office will call with date/time, If you dont hear from them,please give them a call Contact information: [address] [id number] mc [place] [address] [phone]               Allergies  Allergen Reactions   Ampicillin Rash   Erythromycin Hives   Statins Other (See Comments)    Foggy head   Alendronate Sodium     Other reaction(s): jaw pain   Venlafaxine     Other reaction(s): non-therpeutic      Consultations:  None   Other Procedures/Studies: DG Chest 2 View  Result Date: 09/02/2021 CLINICAL DATA:  Tachycardia. EXAM: CHEST - 2 VIEW COMPARISON:  Chest x-ray 12/20/2007. FINDINGS: The heart size and mediastinal contours are within normal limits. Both lungs are clear. The visualized skeletal structures are unremarkable. IMPRESSION: No active cardiopulmonary disease. Electronically Signed   By: Darliss Cheney M.D.   On: 09/02/2021 15:57   CT Angio Chest PE W and/or Wo Contrast  Result Date:  09/02/2021 CLINICAL DATA:  Shortness of breath and tachycardia EXAM: CT ANGIOGRAPHY CHEST WITH CONTRAST TECHNIQUE: Multidetector CT imaging of the chest was performed using the standard protocol during bolus administration of intravenous contrast. Multiplanar CT image reconstructions and MIPs were obtained to evaluate the vascular anatomy. RADIATION DOSE REDUCTION: This exam was performed according to the departmental dose-optimization program which includes automated exposure control, adjustment of the mA and/or kV according to patient size and/or use of iterative reconstruction technique. CONTRAST:  65mL OMNIPAQUE IOHEXOL 350 MG/ML SOLN COMPARISON:  Chest x-ray 09/02/2021 FINDINGS: Cardiovascular: Satisfactory opacification of the pulmonary arteries to the segmental level. Positive for acute thrombus within the right upper lobar and segmental vessels. Acute thrombus within right middle lobe segmental and subsegmental vessels with thrombus present in the right descending pulmonary artery and right lower lobe segmental and subsegmental vessels. Acute thrombus present within left upper lobe subsegmental vessels, and multiple left lower lobe segmental and subsegmental vessels. Elevated RV LV ratio of 1.18. There appears to be hypertrophy of the left ventricle. Normal cardiac size. No pericardial effusion. Nonaneurysmal aorta. Mediastinum/Nodes: Midline trachea. No thyroid mass. No suspicious lymph nodes. Esophagus within normal limits Lungs/Pleura: No consolidation, pleural effusion or pneumothorax. Mild mosaic density which may be due to diffuse atelectasis, small airways disease or potential mosaic perfusion given bilateral PE. Upper Abdomen: No acute abnormality.  Splenic granuloma. Musculoskeletal: No chest wall abnormality. No acute or significant osseous findings. Review of the MIP images confirms the above findings. IMPRESSION: Positive for acute right greater than left pulmonary emboli. Positive for acute PE  with CT evidence of right heart strain (RV/LV Ratio = 1.18) consistent with at least submassive (intermediate risk) PE. The presence of right heart strain has been associated with an increased risk of morbidity and mortality. Please refer to the "PE Focused" order set in EPIC. Negative for acute airspace disease. Critical Value/emergent results were called by telephone at the time of interpretation on 09/02/2021 at 7:56 pm to provider [facility] , who verbally acknowledged these results. Electronically Signed   By: Jasmine Pang M.D.   On: 09/02/2021 19:56   ECHOCARDIOGRAM COMPLETE  Result Date: 09/04/2021    ECHOCARDIOGRAM REPORT   Patient Name:   Samantha Steele Date of Exam: 09/04/2021 Medical Rec #:  [id number]      Height:       63.5 in Accession #:    [id number]     Weight:       146.0 lb Date of Birth:  [birth year]      BSA:  1.702 m Patient Age:    70 years       BP:           157/72 mmHg Patient Gender: F              HR:           82 bpm. Exam Location:  Inpatient Procedure: 2D Echo, Cardiac Doppler and Color Doppler Indications:    Acute pulmonary embolus  History:        Patient has no prior history of Echocardiogram examinations.                 Risk Factors:Hypertension and Dyslipidemia. DOE.  Sonographer:    Ross Ludwig RDCS (AE) Referring Phys: [id number] ERIC CHEN IMPRESSIONS  1. Left ventricular ejection fraction, by estimation, is 65 to 70%. The left ventricle has normal function. The left ventricle has no regional wall motion abnormalities. Left ventricular diastolic parameters were normal.  2. Right ventricular systolic function is normal. The right ventricular size is normal. Tricuspid regurgitation signal is inadequate for assessing PA pressure.  3. The mitral valve is normal in structure. No evidence of mitral valve regurgitation. No evidence of mitral stenosis.  4. The aortic valve was not well visualized. Aortic valve regurgitation is not visualized. No aortic stenosis is present.   5. The inferior vena cava is normal in size with greater than 50% respiratory variability, suggesting right atrial pressure of 3 mmHg. FINDINGS  Left Ventricle: Left ventricular ejection fraction, by estimation, is 65 to 70%. The left ventricle has normal function. The left ventricle has no regional wall motion abnormalities. The left ventricular internal cavity size was normal in size. There is  no left ventricular hypertrophy. Left ventricular diastolic parameters were normal. Right Ventricle: The right ventricular size is normal. Right vetricular wall thickness was not well visualized. Right ventricular systolic function is normal. Tricuspid regurgitation signal is inadequate for assessing PA pressure. Left Atrium: Left atrial size was normal in size. Right Atrium: Right atrial size was not well visualized. Pericardium: Trivial pericardial effusion is present. Mitral Valve: The mitral valve is normal in structure. No evidence of mitral valve regurgitation. No evidence of mitral valve stenosis. Tricuspid Valve: The tricuspid valve is normal in structure. Tricuspid valve regurgitation is trivial. Aortic Valve: The aortic valve was not well visualized. Aortic valve regurgitation is not visualized. No aortic stenosis is present. Aortic valve mean gradient measures 5.0 mmHg. Aortic valve peak gradient measures 8.6 mmHg. Aortic valve area, by VTI measures 2.70 cm. Pulmonic Valve: The pulmonic valve was not well visualized. Pulmonic valve regurgitation is not visualized. Aorta: The aortic root and ascending aorta are structurally normal, with no evidence of dilitation. Venous: The inferior vena cava is normal in size with greater than 50% respiratory variability, suggesting right atrial pressure of 3 mmHg. IAS/Shunts: The interatrial septum was not well visualized.  LEFT VENTRICLE PLAX 2D LVIDd:         3.80 cm   Diastology LVIDs:         2.20 cm   LV e' medial:    10.50 cm/s LV PW:         0.90 cm   LV E/e' medial:   8.5 LV IVS:        0.90 cm   LV e' lateral:   7.35 cm/s LVOT diam:     1.90 cm   LV E/e' lateral: 12.2 LV SV:         82 LV SV  Index:   48 LVOT Area:     2.84 cm  RIGHT VENTRICLE             IVC RV Basal diam:  3.30 cm     IVC diam: 0.80 cm RV Mid diam:    2.50 cm RV S prime:     13.70 cm/s TAPSE (M-mode): 2.0 cm LEFT ATRIUM           Index        RIGHT ATRIUM           Index LA diam:      2.10 cm 1.23 cm/m   RA Area:     12.00 cm LA Vol (A2C): 15.5 ml 9.11 ml/m   RA Volume:   28.30 ml  16.63 ml/m LA Vol (A4C): 28.7 ml 16.87 ml/m  AORTIC VALVE AV Area (Vmax):    2.43 cm AV Area (Vmean):   2.73 cm AV Area (VTI):     2.70 cm AV Vmax:           147.00 cm/s AV Vmean:          106.000 cm/s AV VTI:            0.304 m AV Peak Grad:      8.6 mmHg AV Mean Grad:      5.0 mmHg LVOT Vmax:         126.00 cm/s LVOT Vmean:        102.000 cm/s LVOT VTI:          0.290 m LVOT/AV VTI ratio: 0.95  AORTA Ao Root diam: 2.90 cm Ao Asc diam:  2.80 cm MITRAL VALVE MV Area (PHT): 4.29 cm    SHUNTS MV Decel Time: 177 msec    Systemic VTI:  0.29 m MV E velocity: 89.60 cm/s  Systemic Diam: 1.90 cm MV A velocity: 85.40 cm/s MV E/A ratio:  1.05 Epifanio Lesches MD Electronically signed by Epifanio Lesches MD Signature Date/Time: 09/04/2021/3:15:21 PM    Final    VAS [place] LOWER EXTREMITY VENOUS (DVT)  Result Date: 09/03/2021  Lower Venous DVT Study Patient Name:  JEANISE DURFEY  Date of Exam:   09/03/2021 Medical Rec #: [id number]       Accession #:    [id number] Date of Birth: January 31, 1951       Patient Gender: F Patient Age:   79 years Exam Location:  [facility] Procedure:      VAS [place] LOWER EXTREMITY VENOUS (DVT) Referring Phys: ERIC CHEN --------------------------------------------------------------------------------  Indications: Pulmonary embolism, and recent right calf cramping.  Comparison Study: No prior study Performing Technologist: Gertie Fey MHA, RDMS, RVT, RDCS  Examination Guidelines: A complete  evaluation includes B-mode imaging, spectral Doppler, color Doppler, and power Doppler as needed of all accessible portions of each vessel. Bilateral testing is considered an integral part of a complete examination. Limited examinations for reoccurring indications may be performed as noted.  +---------+---------------+---------+-----------+----------+--------------+  RIGHT     Compressibility Phasicity Spontaneity Properties Thrombus Aging  +---------+---------------+---------+-----------+----------+--------------+  CFV       Full            Yes       Yes                                    +---------+---------------+---------+-----------+----------+--------------+  SFJ       Full                                                             +---------+---------------+---------+-----------+----------+--------------+  FV Prox   Full                                                             +---------+---------------+---------+-----------+----------+--------------+  FV Mid    Full                                                             +---------+---------------+---------+-----------+----------+--------------+  FV Distal Full                                                             +---------+---------------+---------+-----------+----------+--------------+  PFV       Full                                                             +---------+---------------+---------+-----------+----------+--------------+  POP       Full            Yes       Yes                                    +---------+---------------+---------+-----------+----------+--------------+  PTV       Full                                                             +---------+---------------+---------+-----------+----------+--------------+  PERO      None                      No                     Acute           +---------+---------------+---------+-----------+----------+--------------+    +---------+---------------+---------+-----------+----------+--------------+  LEFT      Compressibility Phasicity Spontaneity Properties Thrombus Aging  +---------+---------------+---------+-----------+----------+--------------+  CFV       Full            Yes       Yes                                    +---------+---------------+---------+-----------+----------+--------------+  SFJ       Full                                                             +---------+---------------+---------+-----------+----------+--------------+  FV Prox   Full                                                             +---------+---------------+---------+-----------+----------+--------------+  FV Mid    Full                                                             +---------+---------------+---------+-----------+----------+--------------+  FV Distal Full                                                             +---------+---------------+---------+-----------+----------+--------------+  PFV       Full                                                             +---------+---------------+---------+-----------+----------+--------------+  POP       Full            Yes       Yes                                    +---------+---------------+---------+-----------+----------+--------------+  PTV       Full                                                             +---------+---------------+---------+-----------+----------+--------------+  PERO      Full                                                             +---------+---------------+---------+-----------+----------+--------------+     Summary: RIGHT: - Findings consistent with acute deep vein thrombosis involving the right peroneal veins. - No cystic structure found in the popliteal fossa.  LEFT: - No evidence of common femoral vein obstruction. - No cystic structure found in the popliteal fossa.  *See table(s) above for measurements and observations. Electronically signed by  Waverly Ferrari MD on 09/03/2021 at 12:41:22 PM.    Final      TODAY-DAY OF DISCHARGE:  Subjective:   Asucena Galer today has no headache,no chest abdominal pain,no new weakness tingling or numbness, feels much better wants to go home today.   Objective:   Blood pressure (!) 157/72, pulse 76, temperature 97.9 F (36.6 C), temperature source Oral, resp. rate 20, height  5' 3.5" (1.613 m), weight 66.2 kg, SpO2 96 %.  Intake/Output Summary (Last 24 hours) at 09/04/2021 2106 Last data filed at 09/04/2021 0815 Gross per 24 hour  Intake 569.17 ml  Output --  Net 569.17 ml   Filed Weights   09/02/21 2115  Weight: 66.2 kg    Exam: Awake Alert, Oriented *3, No new F.N deficits, Normal affect [place].AT,PERRAL Supple Neck,No JVD, No cervical lymphadenopathy appriciated.  Symmetrical Chest wall movement, Good air movement bilaterally, CTAB RRR,No Gallops,Rubs or new Murmurs, No Parasternal Heave +ve B.Sounds, Abd Soft, Non tender, No organomegaly appriciated, No rebound -guarding or rigidity. No Cyanosis, Clubbing or edema, No new Rash or bruise   PERTINENT RADIOLOGIC STUDIES: ECHOCARDIOGRAM COMPLETE  Result Date: 09/04/2021    ECHOCARDIOGRAM REPORT   Patient Name:   Samantha Steele Date of Exam: 09/04/2021 Medical Rec #:  [id number]      Height:       63.5 in Accession #:    [id number]     Weight:       146.0 lb Date of Birth:  1950/11/21      BSA:          1.702 m Patient Age:    70 years       BP:           157/72 mmHg Patient Gender: F              HR:           82 bpm. Exam Location:  Inpatient Procedure: 2D Echo, Cardiac Doppler and Color Doppler Indications:    Acute pulmonary embolus  History:        Patient has no prior history of Echocardiogram examinations.                 Risk Factors:Hypertension and Dyslipidemia. DOE.  Sonographer:    Ross Ludwig RDCS (AE) Referring Phys: [id number] ERIC CHEN IMPRESSIONS  1. Left ventricular ejection fraction, by estimation, is 65 to 70%. The left  ventricle has normal function. The left ventricle has no regional wall motion abnormalities. Left ventricular diastolic parameters were normal.  2. Right ventricular systolic function is normal. The right ventricular size is normal. Tricuspid regurgitation signal is inadequate for assessing PA pressure.  3. The mitral valve is normal in structure. No evidence of mitral valve regurgitation. No evidence of mitral stenosis.  4. The aortic valve was not well visualized. Aortic valve regurgitation is not visualized. No aortic stenosis is present.  5. The inferior vena cava is normal in size with greater than 50% respiratory variability, suggesting right atrial pressure of 3 mmHg. FINDINGS  Left Ventricle: Left ventricular ejection fraction, by estimation, is 65 to 70%. The left ventricle has normal function. The left ventricle has no regional wall motion abnormalities. The left ventricular internal cavity size was normal in size. There is  no left ventricular hypertrophy. Left ventricular diastolic parameters were normal. Right Ventricle: The right ventricular size is normal. Right vetricular wall thickness was not well visualized. Right ventricular systolic function is normal. Tricuspid regurgitation signal is inadequate for assessing PA pressure. Left Atrium: Left atrial size was normal in size. Right Atrium: Right atrial size was not well visualized. Pericardium: Trivial pericardial effusion is present. Mitral Valve: The mitral valve is normal in structure. No evidence of mitral valve regurgitation. No evidence of mitral valve stenosis. Tricuspid Valve: The tricuspid valve is normal in structure. Tricuspid valve regurgitation is trivial. Aortic Valve: The aortic valve was not  well visualized. Aortic valve regurgitation is not visualized. No aortic stenosis is present. Aortic valve mean gradient measures 5.0 mmHg. Aortic valve peak gradient measures 8.6 mmHg. Aortic valve area, by VTI measures 2.70 cm. Pulmonic Valve:  The pulmonic valve was not well visualized. Pulmonic valve regurgitation is not visualized. Aorta: The aortic root and ascending aorta are structurally normal, with no evidence of dilitation. Venous: The inferior vena cava is normal in size with greater than 50% respiratory variability, suggesting right atrial pressure of 3 mmHg. IAS/Shunts: The interatrial septum was not well visualized.  LEFT VENTRICLE PLAX 2D LVIDd:         3.80 cm   Diastology LVIDs:         2.20 cm   LV e' medial:    10.50 cm/s LV PW:         0.90 cm   LV E/e' medial:  8.5 LV IVS:        0.90 cm   LV e' lateral:   7.35 cm/s LVOT diam:     1.90 cm   LV E/e' lateral: 12.2 LV SV:         82 LV SV Index:   48 LVOT Area:     2.84 cm  RIGHT VENTRICLE             IVC RV Basal diam:  3.30 cm     IVC diam: 0.80 cm RV Mid diam:    2.50 cm RV S prime:     13.70 cm/s TAPSE (M-mode): 2.0 cm LEFT ATRIUM           Index        RIGHT ATRIUM           Index LA diam:      2.10 cm 1.23 cm/m   RA Area:     12.00 cm LA Vol (A2C): 15.5 ml 9.11 ml/m   RA Volume:   28.30 ml  16.63 ml/m LA Vol (A4C): 28.7 ml 16.87 ml/m  AORTIC VALVE AV Area (Vmax):    2.43 cm AV Area (Vmean):   2.73 cm AV Area (VTI):     2.70 cm AV Vmax:           147.00 cm/s AV Vmean:          106.000 cm/s AV VTI:            0.304 m AV Peak Grad:      8.6 mmHg AV Mean Grad:      5.0 mmHg LVOT Vmax:         126.00 cm/s LVOT Vmean:        102.000 cm/s LVOT VTI:          0.290 m LVOT/AV VTI ratio: 0.95  AORTA Ao Root diam: 2.90 cm Ao Asc diam:  2.80 cm MITRAL VALVE MV Area (PHT): 4.29 cm    SHUNTS MV Decel Time: 177 msec    Systemic VTI:  0.29 m MV E velocity: 89.60 cm/s  Systemic Diam: 1.90 cm MV A velocity: 85.40 cm/s MV E/A ratio:  1.05 Epifanio Lesches MD Electronically signed by Epifanio Lesches MD Signature Date/Time: 09/04/2021/3:15:21 PM    Final    VAS [place] LOWER EXTREMITY VENOUS (DVT)  Result Date: 09/03/2021  Lower Venous DVT Study Patient Name:  Samantha Steele  Date of  Exam:   09/03/2021 Medical Rec #: [id number]       Accession #:    [id number] Date of Birth: 12/28/1950  Patient Gender: F Patient Age:   67 years Exam Location:  [facility] Procedure:      VAS [place] LOWER EXTREMITY VENOUS (DVT) Referring Phys: ERIC CHEN --------------------------------------------------------------------------------  Indications: Pulmonary embolism, and recent right calf cramping.  Comparison Study: No prior study Performing Technologist: Gertie Fey MHA, RDMS, RVT, RDCS  Examination Guidelines: A complete evaluation includes B-mode imaging, spectral Doppler, color Doppler, and power Doppler as needed of all accessible portions of each vessel. Bilateral testing is considered an integral part of a complete examination. Limited examinations for reoccurring indications may be performed as noted.  +---------+---------------+---------+-----------+----------+--------------+  RIGHT     Compressibility Phasicity Spontaneity Properties Thrombus Aging  +---------+---------------+---------+-----------+----------+--------------+  CFV       Full            Yes       Yes                                    +---------+---------------+---------+-----------+----------+--------------+  SFJ       Full                                                             +---------+---------------+---------+-----------+----------+--------------+  FV Prox   Full                                                             +---------+---------------+---------+-----------+----------+--------------+  FV Mid    Full                                                             +---------+---------------+---------+-----------+----------+--------------+  FV Distal Full                                                             +---------+---------------+---------+-----------+----------+--------------+  PFV       Full                                                              +---------+---------------+---------+-----------+----------+--------------+  POP       Full            Yes       Yes                                    +---------+---------------+---------+-----------+----------+--------------+  PTV       Full                                                             +---------+---------------+---------+-----------+----------+--------------+  PERO      None                      No                     Acute           +---------+---------------+---------+-----------+----------+--------------+   +---------+---------------+---------+-----------+----------+--------------+  LEFT      Compressibility Phasicity Spontaneity Properties Thrombus Aging  +---------+---------------+---------+-----------+----------+--------------+  CFV       Full            Yes       Yes                                    +---------+---------------+---------+-----------+----------+--------------+  SFJ       Full                                                             +---------+---------------+---------+-----------+----------+--------------+  FV Prox   Full                                                             +---------+---------------+---------+-----------+----------+--------------+  FV Mid    Full                                                             +---------+---------------+---------+-----------+----------+--------------+  FV Distal Full                                                             +---------+---------------+---------+-----------+----------+--------------+  PFV       Full                                                             +---------+---------------+---------+-----------+----------+--------------+  POP       Full            Yes       Yes                                    +---------+---------------+---------+-----------+----------+--------------+  PTV       Full                                                              +---------+---------------+---------+-----------+----------+--------------+  PERO      Full                                                             +---------+---------------+---------+-----------+----------+--------------+     Summary: RIGHT: - Findings consistent with acute deep vein thrombosis involving the right peroneal veins. - No cystic structure found in the popliteal fossa.  LEFT: - No evidence of common femoral vein obstruction. - No cystic structure found in the popliteal fossa.  *See table(s) above for measurements and observations. Electronically signed by Waverly Ferrarihristopher Dickson MD on 09/03/2021 at 12:41:22 PM.    Final      PERTINENT LAB RESULTS: CBC: Recent Labs    09/03/21 0335 09/04/21 0040  WBC 7.9 6.2  HGB 14.3 13.9  HCT 44.8 42.3  PLT 204 143*   CMET CMP     Component Value Date/Time   NA 140 09/03/2021 0335   K 4.0 09/03/2021 0335   CL 108 09/03/2021 0335   CO2 23 09/03/2021 0335   GLUCOSE 102 (H) 09/03/2021 0335   BUN 14 09/03/2021 0335   CREATININE 0.98 09/03/2021 0335   CALCIUM 9.2 09/03/2021 0335   PROT 6.5 09/03/2021 0335   ALBUMIN 3.9 09/03/2021 0335   AST 16 09/03/2021 0335   ALT 17 09/03/2021 0335   ALKPHOS 58 09/03/2021 0335   BILITOT 0.8 09/03/2021 0335   GFRNONAA >60 09/03/2021 0335    GFR Estimated Creatinine Clearance: 48.7 mL/min (by C-G formula based on SCr of 0.98 mg/dL). No results for input(s): LIPASE, AMYLASE in the last 72 hours. No results for input(s): CKTOTAL, CKMB, CKMBINDEX, TROPONINI in the last 72 hours. Invalid input(s): POCBNP No results for input(s): DDIMER in the last 72 hours. No results for input(s): HGBA1C in the last 72 hours. No results for input(s): CHOL, HDL, LDLCALC, TRIG, CHOLHDL, LDLDIRECT in the last 72 hours. No results for input(s): TSH, T4TOTAL, T3FREE, THYROIDAB in the last 72 hours.  Invalid input(s): FREET3 No results for input(s): VITAMINB12, FOLATE, FERRITIN, TIBC, IRON, RETICCTPCT in the last 72  hours. Coags: No results for input(s): INR in the last 72 hours.  Invalid input(s): PT Microbiology: Recent Results (from the past 240 hour(s))  Resp Panel by RT-PCR (Flu A&B, Covid) Nasopharyngeal Swab     Status: None   Collection Time: 09/02/21  2:50 PM   Specimen: Nasopharyngeal Swab; Nasopharyngeal(NP) swabs in vial transport medium  Result Value Ref Range Status   SARS Coronavirus 2 by RT PCR NEGATIVE NEGATIVE Final    Comment: (NOTE) SARS-CoV-2 target nucleic acids are NOT DETECTED.  The SARS-CoV-2 RNA is generally detectable in upper respiratory specimens during the acute phase of infection. The lowest concentration of SARS-CoV-2 viral copies this assay can detect is 138 copies/mL. A negative result does not preclude SARS-Cov-2 infection and should not be used as the sole basis for treatment or other patient management decisions. A negative result may occur with  improper specimen collection/handling, submission of specimen other than nasopharyngeal swab, presence of viral mutation(s) within the areas targeted by this assay, and inadequate number of viral copies(<138 copies/mL). A negative result must be combined with clinical observations, patient history, and epidemiological information. The expected result is Negative.  Fact Sheet for Patients:  BloggerCourse.comhttps://www.fda.gov/media/152166/download  Fact Sheet for Healthcare Providers:  SeriousBroker.ithttps://www.fda.gov/media/152162/download  This  test is no t yet approved or cleared by the [place] and  has been authorized for detection and/or diagnosis of SARS-CoV-2 by FDA under an Emergency Use Authorization (EUA). This EUA will remain  in effect (meaning this test can be used) for the duration of the COVID-19 declaration under Section 564(b)(1) of the Act, 21 U.S.C.section 360bbb-3(b)(1), unless the authorization is terminated  or revoked sooner.       Influenza A by PCR NEGATIVE NEGATIVE Final   Influenza B by PCR NEGATIVE  NEGATIVE Final    Comment: (NOTE) The Xpert Xpress SARS-CoV-2/FLU/RSV plus assay is intended as an aid in the diagnosis of influenza from Nasopharyngeal swab specimens and should not be used as a sole basis for treatment. Nasal washings and aspirates are unacceptable for Xpert Xpress SARS-CoV-2/FLU/RSV testing.  Fact Sheet for Patients: BloggerCourse.com  Fact Sheet for Healthcare Providers: SeriousBroker.it  This test is not yet approved or cleared by the [place] FDA and has been authorized for detection and/or diagnosis of SARS-CoV-2 by FDA under an Emergency Use Authorization (EUA). This EUA will remain in effect (meaning this test can be used) for the duration of the COVID-19 declaration under Section 564(b)(1) of the Act, 21 U.S.C. section 360bbb-3(b)(1), unless the authorization is terminated or revoked.  Performed at [facility], [address] [place], [place] [address]     FURTHER DISCHARGE INSTRUCTIONS:  Get Medicines reviewed and adjusted: Please take all your medications with you for your next visit with your Primary MD  Laboratory/radiological data: Please request your Primary MD to go over all hospital tests and procedure/radiological results at the follow up, please ask your Primary MD to get all Hospital records sent to his/her office.  In some cases, they will be blood work, cultures and biopsy results pending at the time of your discharge. Please request that your primary care M.D. goes through all the records of your hospital data and follows up on these results.  Also Note the following: If you experience worsening of your admission symptoms, develop shortness of breath, life threatening emergency, suicidal or homicidal thoughts you must seek medical attention immediately by calling 911 or calling your MD immediately  if symptoms less severe.  You must read complete instructions/literature along  with all the possible adverse reactions/side effects for all the Medicines you take and that have been prescribed to you. Take any new Medicines after you have completely understood and accpet all the possible adverse reactions/side effects.   Do not drive when taking Pain medications or sleeping medications (Benzodaizepines)  Do not take more than prescribed Pain, Sleep and Anxiety Medications. It is not advisable to combine anxiety,sleep and pain medications without talking with your primary care practitioner  Special Instructions: If you have smoked or chewed Tobacco  in the last 2 yrs please stop smoking, stop any regular Alcohol  and or any Recreational drug use.  Wear Seat belts while driving.  Please note: You were cared for by a hospitalist during your hospital stay. Once you are discharged, your primary care physician will handle any further medical issues. Please note that NO REFILLS for any discharge medications will be authorized once you are discharged, as it is imperative that you return to your primary care physician (or establish a relationship with a primary care physician if you do not have one) for your post hospital discharge needs so that they can reassess your need for medications and monitor your lab values.  Total Time  spent coordinating discharge including counseling, education and face to face time equals 35 minutes.  SignedJeoffrey Massed 09/04/2021 9:06 PM

## 2021-09-04 NOTE — Progress Notes (Signed)
°  Echocardiogram 2D Echocardiogram has been performed.  Samantha Steele 09/04/2021, 2:58 PM

## 2021-09-04 NOTE — Progress Notes (Signed)
Orders received to discharge patient to home. Telemetry and PIV removed per orders. Discharge instructions reviewed with patient and husband including follow-up appointments, medication schedule, importance of not missing a dose of Eliquis, diet, activity, and when to call for emergency services. Patient and husband have no further questions at this time. Patient was taken to hospital entrance to be transported home via private vehicle.

## 2021-09-04 NOTE — Progress Notes (Addendum)
PROGRESS NOTE        PATIENT DETAILS Name: Samantha Steele Age: 71 y.o. Sex: female Date of Birth: [birth year] Admit Date: 09/02/2021 Admitting Physician Kristopher Oppenheim, DO [id number], Marjory Lies, MD  Brief Narrative: Patient is a 71 y.o. female with history of HTN, HLD-who presented with few days history of shortness of breath with exertion-found to have pulm embolism.  Subjective: No major issues overnight- no SOB when walking in the room  Objective: Vitals: Blood pressure (!) 157/72, pulse 76, temperature 97.9 F (36.6 C), temperature source Oral, resp. rate 20, height 5' 3.5" (1.613 m), weight 66.2 kg, SpO2 96 %.   Exam: Gen Exam:Alert awake-not in any distress HEENT:atraumatic, normocephalic Chest: B/L clear to auscultation anteriorly CVS:S1S2 regular Abdomen:soft non tender, non distended Extremities:no edema Neurology: Non focal Skin: no rash   Pertinent Labs/Radiology: CBC Latest Ref Rng & Units 09/04/2021 09/03/2021 09/02/2021  WBC 4.0 - 10.5 K/uL 6.2 7.9 8.0  Hemoglobin 12.0 - 15.0 g/dL 13.9 14.3 15.0  Hematocrit 36.0 - 46.0 % 42.3 44.8 45.8  Platelets 150 - 400 K/uL 143(L) 204 199    Lab Results  Component Value Date   NA 140 09/03/2021   K 4.0 09/03/2021   CL 108 09/03/2021   CO2 23 09/03/2021      Assessment/Plan: * Submassive acute pulmonary embolism with acute DVT involving the right peroneal vein- (present on admission) Although has evidence of RV strain on CTA-she remains  hemodynamically stable on room air-appears to be unprovoked VTE.  Maintained on IV heparin since admission-will be transitioned to Eliquis today (risk/alternatives/benefits discussed in detail).  She is overall improved-has been ambulating around in the her room without any major issues. Awaiting TEE-monitor closely-maybe able to go home soon if she remains stable if no major issues on Echo.  She will also need outpatient hematology referral-I will send an epic  referral.    Essential hypertension- (present on admission) Stable. Continue losartan.  Hyperlipidemia- (present on admission) Continue crestor.   BMI Estimated body mass index is 25.46 kg/m as calculated from the following:   Height as of this encounter: 5' 3.5" (1.613 m).   Weight as of this encounter: 66.2 kg.    DVT Prophylaxis: IV Heparin Procedures: None Consults: None Code Status:Full code Family Communication: None at bedside   Disposition Plan: Status is: Observation  The patient will require care spanning > 2 midnights and should be moved to inpatient because: Submassive PE-on IV heparin-awaiting work-up-if remains hemodynamically stable-we will switch to oral anticoagulant in the next 12 to 24 hours.  Probably discharge home on 1/22.    Diet: Diet Order             Diet - low sodium heart healthy           Diet regular Room service appropriate? Yes; Fluid consistency: Thin  Diet effective now                     Antimicrobial agents: Anti-infectives (From admission, onward)    None        MEDICATIONS: Scheduled Meds:  apixaban  10 mg Oral BID   Followed by   Derrill Memo ON 09/10/2021] apixaban  5 mg Oral BID   losartan  25 mg Oral Daily   [START ON 09/05/2021] rosuvastatin  2.5 mg Oral Q M,W,F   Continuous Infusions:  PRN Meds:.acetaminophen **OR** acetaminophen, melatonin, ondansetron **OR** ondansetron (ZOFRAN) IV   I have personally reviewed following labs and imaging studies  LABORATORY DATA: CBC: Recent Labs  Lab 09/02/21 1529 09/03/21 0335 09/04/21 0040  WBC 8.0 7.9 6.2  NEUTROABS 5.6 4.5  --   HGB 15.0 14.3 13.9  HCT 45.8 44.8 42.3  MCV 90.7 91.4 91.2  PLT 199 204 143*    Basic Metabolic Panel: Recent Labs  Lab 09/02/21 1529 09/03/21 0335  NA 140 140  K 3.7 4.0  CL 108 108  CO2 22 23  GLUCOSE 142* 102*  BUN 17 14  CREATININE 1.07* 0.98  CALCIUM 9.7 9.2    GFR: Estimated Creatinine Clearance: 48.7 mL/min  (by C-G formula based on SCr of 0.98 mg/dL).  Liver Function Tests: Recent Labs  Lab 09/02/21 1529 09/03/21 0335  AST 23 16  ALT 22 17  ALKPHOS 60 58  BILITOT 0.8 0.8  PROT 7.0 6.5  ALBUMIN 4.2 3.9   No results for input(s): LIPASE, AMYLASE in the last 168 hours. No results for input(s): AMMONIA in the last 168 hours.  Coagulation Profile: No results for input(s): INR, PROTIME in the last 168 hours.  Cardiac Enzymes: No results for input(s): CKTOTAL, CKMB, CKMBINDEX, TROPONINI in the last 168 hours.  BNP (last 3 results) No results for input(s): PROBNP in the last 8760 hours.  Lipid Profile: No results for input(s): CHOL, HDL, LDLCALC, TRIG, CHOLHDL, LDLDIRECT in the last 72 hours.  Thyroid Function Tests: No results for input(s): TSH, T4TOTAL, FREET4, T3FREE, THYROIDAB in the last 72 hours.  Anemia Panel: No results for input(s): VITAMINB12, FOLATE, FERRITIN, TIBC, IRON, RETICCTPCT in the last 72 hours.  Urine analysis: No results found for: COLORURINE, APPEARANCEUR, LABSPEC, PHURINE, GLUCOSEU, HGBUR, BILIRUBINUR, KETONESUR, PROTEINUR, UROBILINOGEN, NITRITE, LEUKOCYTESUR  Sepsis Labs: Lactic Acid, Venous No results found for: LATICACIDVEN  MICROBIOLOGY: Recent Results (from the past 240 hour(s))  Resp Panel by RT-PCR (Flu A&B, Covid) Nasopharyngeal Swab     Status: None   Collection Time: 09/02/21  2:50 PM   Specimen: Nasopharyngeal Swab; Nasopharyngeal(NP) swabs in vial transport medium  Result Value Ref Range Status   SARS Coronavirus 2 by RT PCR NEGATIVE NEGATIVE Final    Comment: (NOTE) SARS-CoV-2 target nucleic acids are NOT DETECTED.  The SARS-CoV-2 RNA is generally detectable in upper respiratory specimens during the acute phase of infection. The lowest concentration of SARS-CoV-2 viral copies this assay can detect is 138 copies/mL. A negative result does not preclude SARS-Cov-2 infection and should not be used as the sole basis for treatment or other  patient management decisions. A negative result may occur with  improper specimen collection/handling, submission of specimen other than nasopharyngeal swab, presence of viral mutation(s) within the areas targeted by this assay, and inadequate number of viral copies(<138 copies/mL). A negative result must be combined with clinical observations, patient history, and epidemiological information. The expected result is Negative.  Fact Sheet for Patients:  EntrepreneurPulse.com.au  Fact Sheet for Healthcare Providers:  IncredibleEmployment.be  This test is no t yet approved or cleared by the [place] FDA and  has been authorized for detection and/or diagnosis of SARS-CoV-2 by FDA under an Emergency Use Authorization (EUA). This EUA will remain  in effect (meaning this test can be used) for the duration of the COVID-19 declaration under Section 564(b)(1) of the Act, 21 U.S.C.section 360bbb-3(b)(1), unless the authorization is terminated  or revoked sooner.       Influenza A by PCR  NEGATIVE NEGATIVE Final   Influenza B by PCR NEGATIVE NEGATIVE Final    Comment: (NOTE) The Xpert Xpress SARS-CoV-2/FLU/RSV plus assay is intended as an aid in the diagnosis of influenza from Nasopharyngeal swab specimens and should not be used as a sole basis for treatment. Nasal washings and aspirates are unacceptable for Xpert Xpress SARS-CoV-2/FLU/RSV testing.  Fact Sheet for Patients: EntrepreneurPulse.com.au  Fact Sheet for Healthcare Providers: IncredibleEmployment.be  This test is not yet approved or cleared by the [place] FDA and has been authorized for detection and/or diagnosis of SARS-CoV-2 by FDA under an Emergency Use Authorization (EUA). This EUA will remain in effect (meaning this test can be used) for the duration of the COVID-19 declaration under Section 564(b)(1) of the Act, 21 U.S.C. section  360bbb-3(b)(1), unless the authorization is terminated or revoked.  Performed at [facility], [address], [place], [place] [address]     RADIOLOGY STUDIES/RESULTS: DG Chest 2 View  Result Date: 09/02/2021 CLINICAL DATA:  Tachycardia. EXAM: CHEST - 2 VIEW COMPARISON:  Chest x-ray 12/20/2007. FINDINGS: The heart size and mediastinal contours are within normal limits. Both lungs are clear. The visualized skeletal structures are unremarkable. IMPRESSION: No active cardiopulmonary disease. Electronically Signed   By: Ronney Asters M.D.   On: 09/02/2021 15:57   CT Angio Chest PE W and/or Wo Contrast  Result Date: 09/02/2021 CLINICAL DATA:  Shortness of breath and tachycardia EXAM: CT ANGIOGRAPHY CHEST WITH CONTRAST TECHNIQUE: Multidetector CT imaging of the chest was performed using the standard protocol during bolus administration of intravenous contrast. Multiplanar CT image reconstructions and MIPs were obtained to evaluate the vascular anatomy. RADIATION DOSE REDUCTION: This exam was performed according to the departmental dose-optimization program which includes automated exposure control, adjustment of the mA and/or kV according to patient size and/or use of iterative reconstruction technique. CONTRAST:  61mL OMNIPAQUE IOHEXOL 350 MG/ML SOLN COMPARISON:  Chest x-ray 09/02/2021 FINDINGS: Cardiovascular: Satisfactory opacification of the pulmonary arteries to the segmental level. Positive for acute thrombus within the right upper lobar and segmental vessels. Acute thrombus within right middle lobe segmental and subsegmental vessels with thrombus present in the right descending pulmonary artery and right lower lobe segmental and subsegmental vessels. Acute thrombus present within left upper lobe subsegmental vessels, and multiple left lower lobe segmental and subsegmental vessels. Elevated RV LV ratio of 1.18. There appears to be hypertrophy of the left ventricle. Normal cardiac size. No  pericardial effusion. Nonaneurysmal aorta. Mediastinum/Nodes: Midline trachea. No thyroid mass. No suspicious lymph nodes. Esophagus within normal limits Lungs/Pleura: No consolidation, pleural effusion or pneumothorax. Mild mosaic density which may be due to diffuse atelectasis, small airways disease or potential mosaic perfusion given bilateral PE. Upper Abdomen: No acute abnormality.  Splenic granuloma. Musculoskeletal: No chest wall abnormality. No acute or significant osseous findings. Review of the MIP images confirms the above findings. IMPRESSION: Positive for acute right greater than left pulmonary emboli. Positive for acute PE with CT evidence of right heart strain (RV/LV Ratio = 1.18) consistent with at least submassive (intermediate risk) PE. The presence of right heart strain has been associated with an increased risk of morbidity and mortality. Please refer to the "PE Focused" order set in EPIC. Negative for acute airspace disease. Critical Value/emergent results were called by telephone at the time of interpretation on 09/02/2021 at 7:56 pm to provider [facility] , who verbally acknowledged these results. Electronically Signed   By: Donavan Foil M.D.   On: 09/02/2021 19:56  VAS [place] LOWER EXTREMITY VENOUS (DVT)  Result Date: 09/03/2021  Lower Venous DVT Study Patient Name:  Samantha Steele  Date of Exam:   09/03/2021 Medical Rec #: [id number]       Accession #:    [id number] Date of Birth: 1951/01/23       Patient Gender: F Patient Age:   7 years Exam Location:  [facility] Procedure:      VAS [place] LOWER EXTREMITY VENOUS (DVT) Referring Phys: ERIC CHEN --------------------------------------------------------------------------------  Indications: Pulmonary embolism, and recent right calf cramping.  Comparison Study: No prior study Performing Technologist: Maudry Mayhew MHA, RDMS, RVT, RDCS  Examination Guidelines: A complete evaluation includes B-mode imaging, spectral Doppler,  color Doppler, and power Doppler as needed of all accessible portions of each vessel. Bilateral testing is considered an integral part of a complete examination. Limited examinations for reoccurring indications may be performed as noted.  +---------+---------------+---------+-----------+----------+--------------+  RIGHT     Compressibility Phasicity Spontaneity Properties Thrombus Aging  +---------+---------------+---------+-----------+----------+--------------+  CFV       Full            Yes       Yes                                    +---------+---------------+---------+-----------+----------+--------------+  SFJ       Full                                                             +---------+---------------+---------+-----------+----------+--------------+  FV Prox   Full                                                             +---------+---------------+---------+-----------+----------+--------------+  FV Mid    Full                                                             +---------+---------------+---------+-----------+----------+--------------+  FV Distal Full                                                             +---------+---------------+---------+-----------+----------+--------------+  PFV       Full                                                             +---------+---------------+---------+-----------+----------+--------------+  POP       Full            Yes  Yes                                    +---------+---------------+---------+-----------+----------+--------------+  PTV       Full                                                             +---------+---------------+---------+-----------+----------+--------------+  PERO      None                      No                     Acute           +---------+---------------+---------+-----------+----------+--------------+   +---------+---------------+---------+-----------+----------+--------------+  LEFT       Compressibility Phasicity Spontaneity Properties Thrombus Aging  +---------+---------------+---------+-----------+----------+--------------+  CFV       Full            Yes       Yes                                    +---------+---------------+---------+-----------+----------+--------------+  SFJ       Full                                                             +---------+---------------+---------+-----------+----------+--------------+  FV Prox   Full                                                             +---------+---------------+---------+-----------+----------+--------------+  FV Mid    Full                                                             +---------+---------------+---------+-----------+----------+--------------+  FV Distal Full                                                             +---------+---------------+---------+-----------+----------+--------------+  PFV       Full                                                             +---------+---------------+---------+-----------+----------+--------------+  POP  Full            Yes       Yes                                    +---------+---------------+---------+-----------+----------+--------------+  PTV       Full                                                             +---------+---------------+---------+-----------+----------+--------------+  PERO      Full                                                             +---------+---------------+---------+-----------+----------+--------------+     Summary: RIGHT: - Findings consistent with acute deep vein thrombosis involving the right peroneal veins. - No cystic structure found in the popliteal fossa.  LEFT: - No evidence of common femoral vein obstruction. - No cystic structure found in the popliteal fossa.  *See table(s) above for measurements and observations. Electronically signed by Deitra Mayo MD on 09/03/2021 at 12:41:22 PM.    Final      LOS: 1 day    Oren Binet, MD  [facility]    To contact the attending provider between 7A-7P or the covering provider during after hours 7P-7A, please log into the web site [email or web address] and access using universal  password for that web site. If you do not have the password, please call the hospital operator.  09/04/2021, 2:14 PM

## 2021-09-04 NOTE — Progress Notes (Signed)
ANTICOAGULATION CONSULT NOTE - Initial Consult  Pharmacy Consult for heparin > eliquis Indication: pulmonary embolus  Allergies  Allergen Reactions   Ampicillin Rash   Erythromycin Hives   Statins Other (See Comments)    Foggy head   Alendronate Sodium     Other reaction(s): jaw pain   Venlafaxine     Other reaction(s): non-therpeutic    Patient Measurements: Height: 5' 3.5" (161.3 cm) Weight: 66.2 kg (146 lb) IBW/kg (Calculated) : 53.55 Heparin Dosing Weight: TBW  Vital Signs: Temp: 97.7 F (36.5 C) (01/22 0400) Temp Source: Oral (01/22 0400) BP: 144/69 (01/22 0405) Pulse Rate: 64 (01/22 0405)  Labs: Recent Labs    09/02/21 1529 09/02/21 1706 09/03/21 0334 09/03/21 0335 09/03/21 1400 09/04/21 0040  HGB 15.0  --   --  14.3  --  13.9  HCT 45.8  --   --  44.8  --  42.3  PLT 199  --   --  204  --  143*  HEPARINUNFRC  --   --  >1.10*  --  0.54 0.70  CREATININE 1.07*  --   --  0.98  --   --   TROPONINIHS 39* 66*  --  87*  --   --      Estimated Creatinine Clearance: 48.7 mL/min (by C-G formula based on SCr of 0.98 mg/dL).   Medical History: Past Medical History:  Diagnosis Date   Hypertension    Osteopenia     Assessment: 26 YOF presenting with SOB and tachycardia, CT angio with PE and + RHS, CT angio suggestive of RV involvement, but pt has remained hemodynamically stable for 24 hours. she is not on anticoagulation PTA, CBC stable. Pharmacy consulted to switch from heparin to eliquis.    Goal of Therapy:  Monitor platelets by anticoagulation protocol: Yes   Plan:  D/c heparin gtt @1000  Start Eliquis 10 mg BID @1000 , Give 10 mg BID x 6 days as pt was either therapeutic or supratherapeutic for 24 hours.  Start Eliquis 5 mg BID 09/10/21 Monitor for s/s bleeding F/u echo for any changes    E. Juanna Pudlo, Student Pharmacist  09/04/2021 8:01 AM

## 2021-09-06 ENCOUNTER — Telehealth: Payer: Self-pay | Admitting: Oncology

## 2021-09-06 DIAGNOSIS — Z79899 Other long term (current) drug therapy: Secondary | ICD-10-CM | POA: Diagnosis not present

## 2021-09-06 DIAGNOSIS — I2699 Other pulmonary embolism without acute cor pulmonale: Secondary | ICD-10-CM | POA: Diagnosis not present

## 2021-09-06 DIAGNOSIS — I82451 Acute embolism and thrombosis of right peroneal vein: Secondary | ICD-10-CM | POA: Diagnosis not present

## 2021-09-06 DIAGNOSIS — D6859 Other primary thrombophilia: Secondary | ICD-10-CM | POA: Diagnosis not present

## 2021-09-06 DIAGNOSIS — M810 Age-related osteoporosis without current pathological fracture: Secondary | ICD-10-CM | POA: Diagnosis not present

## 2021-09-06 DIAGNOSIS — M8580 Other specified disorders of bone density and structure, unspecified site: Secondary | ICD-10-CM | POA: Diagnosis not present

## 2021-09-06 DIAGNOSIS — E785 Hyperlipidemia, unspecified: Secondary | ICD-10-CM | POA: Diagnosis not present

## 2021-09-06 DIAGNOSIS — Z8249 Family history of ischemic heart disease and other diseases of the circulatory system: Secondary | ICD-10-CM | POA: Diagnosis not present

## 2021-09-06 DIAGNOSIS — Z809 Family history of malignant neoplasm, unspecified: Secondary | ICD-10-CM | POA: Diagnosis not present

## 2021-09-06 DIAGNOSIS — Z7901 Long term (current) use of anticoagulants: Secondary | ICD-10-CM | POA: Diagnosis not present

## 2021-09-06 DIAGNOSIS — I10 Essential (primary) hypertension: Secondary | ICD-10-CM | POA: Diagnosis not present

## 2021-09-06 DIAGNOSIS — M1990 Unspecified osteoarthritis, unspecified site: Secondary | ICD-10-CM | POA: Diagnosis not present

## 2021-09-06 DIAGNOSIS — E7800 Pure hypercholesterolemia, unspecified: Secondary | ICD-10-CM | POA: Diagnosis not present

## 2021-09-06 NOTE — Telephone Encounter (Signed)
Contacted patient to schedule initial visit from referral, is aware of date and time.

## 2021-09-12 ENCOUNTER — Other Ambulatory Visit (HOSPITAL_COMMUNITY): Payer: Self-pay | Admitting: Family Medicine

## 2021-09-24 ENCOUNTER — Other Ambulatory Visit: Payer: Self-pay

## 2021-09-24 ENCOUNTER — Emergency Department (HOSPITAL_COMMUNITY)
Admission: EM | Admit: 2021-09-24 | Discharge: 2021-09-24 | Disposition: A | Discharge status: Home / Self Care | Payer: Medicare PPO | Attending: Emergency Medicine | Admitting: Emergency Medicine

## 2021-09-24 ENCOUNTER — Emergency Department (HOSPITAL_COMMUNITY): Payer: Medicare PPO

## 2021-09-24 ENCOUNTER — Encounter (HOSPITAL_COMMUNITY): Payer: Self-pay | Admitting: *Deleted

## 2021-09-24 DIAGNOSIS — H53003 Unspecified amblyopia, bilateral: Secondary | ICD-10-CM | POA: Diagnosis not present

## 2021-09-24 DIAGNOSIS — Z7901 Long term (current) use of anticoagulants: Secondary | ICD-10-CM | POA: Diagnosis not present

## 2021-09-24 DIAGNOSIS — R531 Weakness: Secondary | ICD-10-CM | POA: Insufficient documentation

## 2021-09-24 DIAGNOSIS — Z20822 Contact with and (suspected) exposure to covid-19: Secondary | ICD-10-CM | POA: Diagnosis not present

## 2021-09-24 DIAGNOSIS — H538 Other visual disturbances: Secondary | ICD-10-CM | POA: Diagnosis not present

## 2021-09-24 DIAGNOSIS — I639 Cerebral infarction, unspecified: Secondary | ICD-10-CM | POA: Diagnosis not present

## 2021-09-24 DIAGNOSIS — R9431 Abnormal electrocardiogram [ECG] [EKG]: Secondary | ICD-10-CM | POA: Diagnosis not present

## 2021-09-24 LAB — COMPREHENSIVE METABOLIC PANEL
ALT: 14 U/L (ref 0–44)
AST: 17 U/L (ref 15–41)
Albumin: 4.1 g/dL (ref 3.5–5.0)
Alkaline Phosphatase: 61 U/L (ref 38–126)
Anion gap: 10 (ref 5–15)
BUN: 20 mg/dL (ref 8–23)
CO2: 25 mmol/L (ref 22–32)
Calcium: 9.9 mg/dL (ref 8.9–10.3)
Chloride: 106 mmol/L (ref 98–111)
Creatinine, Ser: 1.05 mg/dL — ABNORMAL HIGH (ref 0.44–1.00)
GFR, Estimated: 57 mL/min — ABNORMAL LOW (ref >60)
Glucose, Bld: 105 mg/dL — ABNORMAL HIGH (ref 70–99)
Potassium: 4.1 mmol/L (ref 3.5–5.1)
Sodium: 141 mmol/L (ref 135–145)
Total Bilirubin: 0.5 mg/dL (ref 0.3–1.2)
Total Protein: 6.8 g/dL (ref 6.5–8.1)

## 2021-09-24 LAB — CBC
HCT: 43.8 % (ref 36.0–46.0)
Hemoglobin: 14.3 g/dL (ref 12.0–15.0)
MCH: 30 pg (ref 26.0–34.0)
MCHC: 32.6 g/dL (ref 30.0–36.0)
MCV: 91.8 fL (ref 80.0–100.0)
Platelets: 230 10*3/uL (ref 150–400)
RBC: 4.77 MIL/uL (ref 3.87–5.11)
RDW: 12 % (ref 11.5–15.5)
WBC: 7.3 10*3/uL (ref 4.0–10.5)
nRBC: 0 % (ref 0.0–0.2)

## 2021-09-24 LAB — DIFFERENTIAL
Abs Immature Granulocytes: 0.02 10*3/uL (ref 0.00–0.07)
Basophils Absolute: 0 10*3/uL (ref 0.0–0.1)
Basophils Relative: 1 %
Eosinophils Absolute: 0.1 10*3/uL (ref 0.0–0.5)
Eosinophils Relative: 2 %
Immature Granulocytes: 0 %
Lymphocytes Relative: 34 %
Lymphs Abs: 2.4 10*3/uL (ref 0.7–4.0)
Monocytes Absolute: 0.7 10*3/uL (ref 0.1–1.0)
Monocytes Relative: 10 %
Neutro Abs: 4 10*3/uL (ref 1.7–7.7)
Neutrophils Relative %: 53 %

## 2021-09-24 LAB — ETHANOL: Alcohol, Ethyl (B): <10 mg/dL (ref <10)

## 2021-09-24 LAB — APTT: aPTT: 28 seconds (ref 24–36)

## 2021-09-24 LAB — I-STAT CHEM 8, ED
BUN: 23 mg/dL (ref 8–23)
Calcium, Ion: 1.21 mmol/L (ref 1.15–1.40)
Chloride: 105 mmol/L (ref 98–111)
Creatinine, Ser: 1 mg/dL (ref 0.44–1.00)
Glucose, Bld: 99 mg/dL (ref 70–99)
HCT: 43 % (ref 36.0–46.0)
Hemoglobin: 14.6 g/dL (ref 12.0–15.0)
Potassium: 3.8 mmol/L (ref 3.5–5.1)
Sodium: 140 mmol/L (ref 135–145)
TCO2: 27 mmol/L (ref 22–32)

## 2021-09-24 LAB — RESP PANEL BY RT-PCR (FLU A&B, COVID) ARPGX2
Influenza A by PCR: NEGATIVE
Influenza B by PCR: NEGATIVE
SARS Coronavirus 2 by RT PCR: NEGATIVE

## 2021-09-24 LAB — PROTIME-INR
INR: 1 (ref 0.8–1.2)
Prothrombin Time: 13.5 seconds (ref 11.4–15.2)

## 2021-09-24 MED ORDER — SODIUM CHLORIDE 0.9 % IV BOLUS
1000.0000 mL | Freq: Once | INTRAVENOUS | Status: AC
  Administered 2021-09-24: 1000 mL via INTRAVENOUS

## 2021-09-24 NOTE — Discharge Instructions (Addendum)
Your labs and imaging did not show any significant abnormality.  We discussed with neurology who felt comfortable with you going home.  Continue to take your blood thinner.  Return for new or worsening symptoms.

## 2021-09-24 NOTE — ED Provider Notes (Signed)
[facility] EMERGENCY DEPARTMENT Provider Note   CSN: [id number] Arrival date & time: 09/24/21  2105  An emergency department physician performed an initial assessment on this suspected stroke patient at 2117.  History  Chief Complaint  Patient presents with   Code Stroke    Samantha Steele is a 71 y.o. female recent diagnosis PE, DVT here for evaluation of feeling unwell.  Patient states she has felt unwell since she was discharged approximately 3 weeks ago.  Has generalized weakness.  Tonight felt generalized weakness and went to lay down.  She was looking at her phone and noted some bilateral blurred vision. No visual field cuts, diplopia.  No headache. Symptoms were bilateral.  Did not note any facial droop, neck pain, sudden onset headache, paresthesias, lateral weakness, dizziness.  She denies any increasing chest pain, shortness of breath.  No emesis.  No swelling to extremities.  Has been compliant with her anticoagulant outpatient without any missed doses. BY the time she arrived to the ED her symptoms have mostly resolved.  HPI     Home Medications Prior to Admission medications   Medication Sig Start Date End Date Taking? Authorizing Provider  acetaminophen (TYLENOL) 500 MG tablet Take 500 mg by mouth every 6 (six) hours as needed for moderate pain.   Yes [provider]  apixaban (ELIQUIS) 5 MG TABS tablet Take 2 tablets (10 mg) twice daily,and on 09/10/21 switch to 1 tablet (5 mg) twice twice daily. 09/04/21  Yes Ghimire, Henreitta Leber, MD  calcium carbonate (OSCAL) 1500 (600 Ca) MG TABS tablet Take 1,500 mg by mouth daily.   Yes [provider]  Cholecalciferol (VITAMIN D3) 50 MCG (2000 UT) capsule Take 2,000 Units by mouth daily.   Yes [provider]  estradiol (ESTRACE) 0.1 MG/GM vaginal cream Place 1 Applicatorful vaginally once a week. 04/25/21  Yes [provider]  losartan (COZAAR) 25 MG tablet Take 25 mg by mouth daily.    Yes [provider]  rosuvastatin (CRESTOR) 5 MG tablet Take 2.5 mg by mouth every Monday, Wednesday, and Friday. 05/28/21  Yes [provider]      Allergies    Ampicillin, Erythromycin, Statins, Alendronate sodium, and Venlafaxine    Review of Systems   Review of Systems  Constitutional: Negative.   HENT: Negative.    Eyes:  Positive for visual disturbance.  Respiratory: Negative.    Cardiovascular: Negative.   Gastrointestinal: Negative.   Genitourinary: Negative.   Musculoskeletal: Negative.   Skin: Negative.   Neurological:  Positive for weakness (generalized).  All other systems reviewed and are negative.  Physical Exam Updated Vital Signs BP (!) 150/77    Pulse 68    Temp 97.8 F (36.6 C) (Oral)    Resp 11    SpO2 97%  Physical Exam Physical Exam  Constitutional: Pt is oriented to person, place, and time. Pt appears well-developed and well-nourished. No distress.  HENT:  Head: Normocephalic and atraumatic.  Mouth/Throat: Oropharynx is clear and moist.  Eyes: Conjunctivae and EOM are normal. Pupils are equal, round, and reactive to light. No scleral icterus.  No horizontal, vertical or rotational nystagmus  Neck: Normal range of motion. Neck supple.  Full active and passive ROM without pain No midline or paraspinal tenderness No nuchal rigidity or meningeal signs  Cardiovascular: Normal rate, regular rhythm and intact distal pulses.   Pulmonary/Chest: Effort normal and breath sounds normal. No respiratory distress. Pt has no wheezes. No rales.  Abdominal: Soft.  Bowel sounds are normal. There is no tenderness. There is no rebound and no guarding.  Musculoskeletal: Normal range of motion.  Lymphadenopathy:    No cervical adenopathy.  Neurological: Pt. is alert and oriented to person, place, and time. He has normal reflexes. No cranial nerve deficit.  Exhibits normal muscle tone. Coordination normal.  Mental Status:  Alert, oriented, thought content  appropriate. Speech fluent without evidence of aphasia. Able to follow 2 step commands without difficulty.  Cranial Nerves:  II:  Peripheral visual fields grossly normal, pupils equal, round, reactive to light III,IV, VI: ptosis not present, extra-ocular motions intact bilaterally  V,VII: smile symmetric, facial light touch sensation equal VIII: hearing grossly normal bilaterally  IX,X: midline uvula rise  XI: bilateral shoulder shrug equal and strong XII: midline tongue extension  Motor:  5/5 in upper and lower extremities bilaterally including strong and equal grip strength and dorsiflexion/plantar flexion Sensory: Pinprick and light touch normal in all extremities.  Deep Tendon Reflexes: 2+ and symmetric  Cerebellar: normal finger-to-nose with bilateral upper extremities Gait: normal gait and balance CV: distal pulses palpable throughout   Skin: Skin is warm and dry. No rash noted. Pt is not diaphoretic.  Psychiatric: Pt has a normal mood and affect. Behavior is normal. Judgment and thought content normal.  Nursing note and vitals reviewed.  ED Results / Procedures / Treatments   Labs (all labs ordered are listed, but only abnormal results are displayed) Labs Reviewed  COMPREHENSIVE METABOLIC PANEL - Abnormal; Notable for the following components:      Result Value   Glucose, Bld 105 (*)    Creatinine, Ser 1.05 (*)    GFR, Estimated 57 (*)    All other components within normal limits  RESP PANEL BY RT-PCR (FLU A&B, COVID) ARPGX2  ETHANOL  PROTIME-INR  APTT  CBC  DIFFERENTIAL  RAPID URINE DRUG SCREEN, HOSP PERFORMED  URINALYSIS, ROUTINE W REFLEX MICROSCOPIC  I-STAT CHEM 8, ED    EKG None  Radiology CT HEAD CODE STROKE WO CONTRAST  Result Date: 09/24/2021 CLINICAL DATA:  Code stroke. Initial evaluation for neuro deficit, stroke, blurry vision. EXAM: CT HEAD WITHOUT CONTRAST TECHNIQUE: Contiguous axial images were obtained from the base of the skull through the vertex  without intravenous contrast. RADIATION DOSE REDUCTION: This exam was performed according to the departmental dose-optimization program which includes automated exposure control, adjustment of the mA and/or kV according to patient size and/or use of iterative reconstruction technique. COMPARISON:  None available. FINDINGS: Brain: Cerebral volume within normal limits for patient age. No evidence for acute intracranial hemorrhage. No findings to suggest acute large vessel territory infarct. No mass lesion, midline shift, or mass effect. Ventricles are normal in size without evidence for hydrocephalus. No extra-axial fluid collection identified. Vascular: No hyperdense vessel identified. Skull: Scalp soft tissues demonstrate no acute abnormality. Calvarium intact. Sinuses/Orbits: Globes and orbital soft tissues within normal limits. Visualized paranasal sinuses are clear. No mastoid effusion. ASPECTS [facility] Stroke Program Early CT Score) - Ganglionic level infarction (caudate, lentiform nuclei, internal capsule, insula, M1-M3 cortex): 7 - Supraganglionic infarction (M4-M6 cortex): 3 Total score (0-10 with 10 being normal): 10 IMPRESSION: 1. Negative head CT.  No acute intracranial abnormality. 2. ASPECTS is 10. These results were communicated to Dr. Otelia Limes at 9:45 pm on 09/24/2021 by text page via the [facility] messaging system. Electronically Signed   By: Rise Mu M.D.   On: 09/24/2021 21:47    Procedures Procedures    Medications Ordered in ED  Medications  sodium chloride 0.9 % bolus 1,000 mL (1,000 mLs Intravenous New Bag/Given 09/24/21 2223)   ED Course/ Medical Decision Making/ A&P    71 year old here for evaluation of feeling unwell and vision changes.  Per chart review was recently admitted for pulmonary embolism start anticoagulation.  Since discharge has been having generalized fatigue and weakness.  She has no new chest pain or shortness of breath today.  No new lower extremity swelling.   Tonight was feeling generally weak, went to lay down.  She went to look at her phone and noted blurring to bilateral vision.  She denies any visual field cuts, eye pain or diplopia.  No sudden onset thunderclap headache.  She had no associated numbness, weakness, slurred speech at that time.  By time she arrived to the emergency department her symptoms had significantly improved.  She was made a code stroke on arrival by triage.  She emergently went to CT scanner.  Per Phylliss Bob, PA-C Dr. Cheral Marker neurology canceled code stroke given no unilateral sx. She does not note a history of MS or demyelinating disease.  No eye pain to suggest ocular etiology of symptoms. No infectious sx. By the time I assessed patient  Labs and imaging personally reviewed and interpreted:  CBC without leukocytosis Metabolic panel without significant abnormality COVID, flu negative CT head without significant abnormality EKG without ischemic changes  CONSULT with Dr. Cheral Marker with Neuro. Does not rec MRI at this time. Low suspicion for acute Neurologic cause.  Patient on reassessment has nonfocal neuro exam without deficit.  I have low suspicion for acute CVA, dissection, bleed, PRESS.  Was initially significantly hypertensive which improved.  I have low suspicion for hypertensive urgency or emergency.  She denies any chest pain, shortness of breath, have low suspicion for acute ACS, worsening PE, dissection.  Given no eye pain and bilateral nature of symptoms I have low suspicion for CRAO, retinal detachment, CRVO, optic neuritis, MS, traumatic injury.  Continues to have nonfocal neuro exam without deficits.  Her symptoms have improved.  Her blood pressures have trended down.  We will have her follow-up outpatient.  She is agreeable  Discussed with attending, Dr. Sabra Heck agrees above treatment, plan and disposition  The patient has been appropriately medically screened and/or stabilized in the ED. I have low suspicion for any  other emergent medical condition which would require further screening, evaluation or treatment in the ED or require inpatient management.  Patient is hemodynamically stable and in no acute distress.  Patient able to ambulate in department prior to ED.  Evaluation does not show acute pathology that would require ongoing or additional emergent interventions while in the emergency department or further inpatient treatment.  I have discussed the diagnosis with the patient and answered all questions.  Pain is been managed while in the emergency department and patient has no further complaints prior to discharge.  Patient is comfortable with plan discussed in room and is stable for discharge at this time.  I have discussed strict return precautions for returning to the emergency department.  Patient was encouraged to follow-up with PCP/specialist refer to at discharge.                            Medical Decision Making Amount and/or Complexity of Data Reviewed Independent Historian: spouse External Data Reviewed: labs, radiology, ECG and notes. Labs: ordered. Decision-making details documented in ED Course. Radiology: ordered and independent interpretation performed. Decision-making details  documented in ED Course. ECG/medicine tests: ordered and independent interpretation performed. Decision-making details documented in ED Course.  Risk OTC drugs. Prescription drug management.          Final Clinical Impression(s) / ED Diagnoses Final diagnoses:  Blurred vision, bilateral    Rx / DC Orders ED Discharge Orders     None         Ladelle Teodoro A, PA-C 09/24/21 2301    Samantha Chapel, MD 09/25/21 1939

## 2021-09-24 NOTE — ED Triage Notes (Signed)
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 She went to lay down and could see her phone. When she returned back to the puzzle at 2030 she noted blurred vision in both eyes. No other neuro deficits on exam. 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 Reports having a frontal headache earlier as well. Elizabeth PA at bedside

## 2021-09-24 NOTE — ED Notes (Signed)
Dr Aldean Jewett canceled code stroke 2137 per PA

## 2021-09-24 NOTE — ED Provider Triage Note (Addendum)
Emergency Medicine Provider Triage Evaluation Note  Samantha Steele , a 71 y.o. female  was evaluated in triage.  Pt complains of blurred vision.  This started at 2030.  She feels like it is both eyes.  She was recently started on anticoagulants due to multiple Pes.   She denies any eye pain  Physical Exam  BP (!) 194/97 (BP Location: Right Arm)    Pulse 83    Temp 97.8 F (36.6 C) (Oral)    Resp 20    SpO2 98%  Gen:   Awake, no distress   Resp:  Normal effort  MSK:   Moves extremities without difficulty  Other:  No pronator drift.  Full EOM bilaterally. Able to lift bilateral legs, no facial droop  Medical Decision Making  Medically screening exam initiated at 9:25 PM.  Appropriate orders placed.  Samantha Steele was informed that the remainder of the evaluation will be completed by another provider, this initial triage assessment does not replace that evaluation, and the importance of remaining in the ED until their evaluation is complete.  Patient with subjective blurred vision bilaterally.    I was called by RN to evaluate patient at 2119, I immediately came to evaluate patient.  Code stroke is activated due to sudden neuro deficit, Triage RN aware, will take patient to CT scan, then to room 28.  Consulting civil engineer notified.   Patients air way cleared by my self at the time of my evaluation.    Samantha Steele, [place] 09/24/21 2131   09/24/21 2137: I spoke with Dr. Otelia Steele who recommended canceling code stroke due to her blurry vision being bilateral in nature.  Code stroke was canceled.    Samantha Steele, [place] 09/24/21 2149

## 2021-09-27 DIAGNOSIS — I2699 Other pulmonary embolism without acute cor pulmonale: Secondary | ICD-10-CM | POA: Diagnosis not present

## 2021-10-03 ENCOUNTER — Inpatient Hospital Stay: Payer: Medicare PPO | Attending: Oncology | Admitting: Oncology

## 2021-10-03 ENCOUNTER — Inpatient Hospital Stay: Payer: Medicare PPO

## 2021-10-03 ENCOUNTER — Other Ambulatory Visit: Payer: Self-pay

## 2021-10-03 VITALS — BP 159/67 | HR 97 | Temp 97.8°F | Resp 19 | Ht 63.0 in | Wt 148.0 lb

## 2021-10-03 DIAGNOSIS — I2699 Other pulmonary embolism without acute cor pulmonale: Secondary | ICD-10-CM | POA: Insufficient documentation

## 2021-10-03 DIAGNOSIS — I10 Essential (primary) hypertension: Secondary | ICD-10-CM | POA: Diagnosis not present

## 2021-10-03 DIAGNOSIS — I82451 Acute embolism and thrombosis of right peroneal vein: Secondary | ICD-10-CM | POA: Diagnosis not present

## 2021-10-03 DIAGNOSIS — R059 Cough, unspecified: Secondary | ICD-10-CM | POA: Diagnosis not present

## 2021-10-03 LAB — ANTITHROMBIN III: AntiThromb III Func: 122 % — ABNORMAL HIGH (ref 75–120)

## 2021-10-03 NOTE — Progress Notes (Signed)
[facility] New Patient Consult   Requesting MD: Maretta Bees, Md [address] [place],  [place] [address]   Samantha Steele 71 y.o.  1951/06/04    Reason for Consult: Deep vein thrombosis/pulmonary embolism   HPI: Samantha Steele presented to the emergency room 09/02/2021 with dyspnea and palpitations.  A CT of the chest revealed acute right greater than left pulmonary emboli with evidence of right heart strain.  Lower extremity Dopplers on 09/03/2021 revealed acute deep vein thrombosis in the right peroneal veins.  She was admitted and placed on IV heparin.  She was transitioned to apixaban on the day of discharge 09/04/2021.  She reports noting difficulty taking walks and a cough throughout the month of January 2023.  She had an episode of right calf pain a few days prior to hospital admission.  Family members were diagnosed with COVID-19 during Christmas 2022.  She reports mild cold "cold "symptoms including rhinorrhea, but was not tested.  She has no previous history of venous or arterial thrombosis.  No prolonged immobility or recent surgery.  She traveled for 5 out of 8 days surrounding the Thanksgiving 2022 holiday including up to 6.5 hours/day of car travel.  No family history of venous or arterial thromboembolic disease.  She continues to have exertional dyspnea.  This has partially improved.  She had an episode of blurred vision 09/24/2021 and was seen in the emergency room.  A CT brain was negative.  The symptoms resolved while she was in the emergency room.   Past Medical History:  Diagnosis Date   Hypertension    Osteopenia     .  G5 P3, Ab2  Past Surgical History:  Procedure Laterality Date   TRIGGER FINGER RELEASE  2015   TRIGGER FINGER RELEASE Left 11/16/2015   Procedure: RELEASE TRIGGER FINGER/A-1 PULLEY LEFT MIDDLE FINGER;  Surgeon: Cindee Salt, MD;  Location: [facility];  Service: Orthopedics;  Laterality: Left;   middle finger   TRIGGER FINGER RELEASE Right 04/03/2017   Procedure: RELEASE TRIGGER FINGER/A-1 PULLEY RIGHT RING;  Surgeon: Cindee Salt, MD;  Location: [facility];  Service: Orthopedics;  Laterality: Right;    Medications: Reviewed  Allergies:  Allergies  Allergen Reactions   Ampicillin Rash   Erythromycin Hives   Statins Other (See Comments)    Foggy head   Alendronate Sodium     Other reaction(s): jaw pain   Venlafaxine     Other reaction(s): non-therpeutic    Family history: Her father had esophagus cancer and was a smoker.  A sister had tongue cancer and was a smoker.  Social History:   She lives with her husband in [place].  She is a retired Careers adviser.  She does not use cigarettes.  Rare alcohol use.  No risk factor for HIV or hepatitis.  She was transfused as a newborn.  She has received COVID-19 and influenza vaccines.  ROS:   Positives include: Exertional dyspnea, cough for 3 weeks prior to hospital admission January 2023, episode of right calf pain prior to hospital admission January 2023, left lower quadrant pain 2 weeks prior to hospital admission lasting 30 minutes, frontal headache for the past few months, 6 pound weight loss since being diagnosed with pulmonary embolism, episode of blurred vision 09/24/2021-CT negative  A complete ROS was otherwise negative.  Physical Exam:  Blood pressure (!) 159/67, pulse 97, temperature 97.8 F (36.6 C), temperature source Oral, resp. rate 19, height 5\' 3"  (  1.6 m), weight 148 lb (67.1 kg), SpO2 98 %.  Lungs: End inspiratory rhonchi at the posterior base bilaterally that cleared after several respirations, no respiratory distress Cardiac: Regular rate and rhythm Abdomen: No hepatosplenomegaly, no mass, nontender  Vascular: No leg edema, the right lower leg is slightly larger than the left side Lymph nodes: No cervical, supraclavicular, axillary, or inguinal nodes Neurologic: Alert and oriented, the  motor exam appears intact in the upper and lower extremities bilaterally Skin: No rash Musculoskeletal: No spine tenderness   LAB:  CBC  Lab Results  Component Value Date   WBC 7.3 09/24/2021   HGB 14.6 09/24/2021   HCT 43.0 09/24/2021   MCV 91.8 09/24/2021   PLT 230 09/24/2021   NEUTROABS 4.0 09/24/2021        CMP  Lab Results  Component Value Date   NA 140 09/24/2021   K 3.8 09/24/2021   CL 105 09/24/2021   CO2 25 09/24/2021   GLUCOSE 99 09/24/2021   BUN 23 09/24/2021   CREATININE 1.00 09/24/2021   CALCIUM 9.9 09/24/2021   PROT 6.8 09/24/2021   ALBUMIN 4.1 09/24/2021   AST 17 09/24/2021   ALT 14 09/24/2021   ALKPHOS 61 09/24/2021   BILITOT 0.5 09/24/2021   GFRNONAA 57 (L) 09/24/2021      Imaging: As per HPI-CT images from 09/02/2021-reviewed with Ms. Eckley    Assessment/Plan:   Right lower extremity deep vein thrombosis and bilateral pulmonary embolism CT chest 09/02/2021-bilateral pulmonary embolism with right heart strain Right peroneal DVT 09/03/2021 Admission 09/02/2021-heparin, transition to apixaban 09/04/2021 Cough and exertional dyspnea secondary to #1 Hypertension   Disposition:   [name] was diagnosed with a right lower extremity DVT and bilateral pulmonary embolism last month.  There is no clear predisposing risk factor for venous thromboembolic disease in her case.  However she was likely infected with COVID-19 in late December 2022 and this may have been related.  I doubt the travel surrounding the Thanksgiving holiday contributed, but this is possible.  There is no clinical evidence for another condition predisposing to venous thromboembolic disease such as a malignancy.  No family history to suggest a hypercoagulation syndrome.  I recommend continuing apixaban anticoagulation.  We submitted a hypercoagulation panel today.  She will return for an office visit in approximately 4 months.  The plan is to discontinue anticoagulation after  a 6-month course and obtain a lupus anticoagulant assay.  We will decide on the indication for indefinite anticoagulation based on the lupus anticoagulant panel.  I recommend that she follow-up with Dr. Docia Chuck if the dyspnea is not improved.  She should main up-to-date on mammography and colonoscopy screening.  Thornton Papas, MD  10/03/2021, 5:01 PM

## 2021-10-04 LAB — PROTEIN S ACTIVITY: Protein S Activity: 103 % (ref 63–140)

## 2021-10-04 LAB — CARDIOLIPIN ANTIBODIES, IGG, IGM, IGA
Anticardiolipin IgA: <9 APL U/mL (ref 0–11)
Anticardiolipin IgG: <9 GPL U/mL (ref 0–14)
Anticardiolipin IgM: <9 MPL U/mL (ref 0–12)

## 2021-10-04 LAB — PROTEIN C ACTIVITY: Protein C Activity: 149 % (ref 73–180)

## 2021-10-04 LAB — PROTEIN S, TOTAL: Protein S Ag, Total: 100 % (ref 60–150)

## 2021-10-04 LAB — HOMOCYSTEINE: Homocysteine: 13.8 umol/L (ref 0.0–19.2)

## 2021-10-05 LAB — BETA-2-GLYCOPROTEIN I ABS, IGG/M/A
Beta-2 Glyco I IgG: <9 GPI IgG units (ref 0–20)
Beta-2-Glycoprotein I IgA: <9 GPI IgA units (ref 0–25)
Beta-2-Glycoprotein I IgM: <9 GPI IgM units (ref 0–32)

## 2021-10-05 LAB — PROTEIN C, TOTAL: Protein C, Total: 138 % (ref 60–150)

## 2021-10-07 LAB — FACTOR 5 LEIDEN

## 2021-10-10 ENCOUNTER — Telehealth: Payer: Self-pay

## 2021-10-10 LAB — PROTHROMBIN GENE MUTATION

## 2021-10-10 NOTE — Telephone Encounter (Signed)
-----   Message from Ladene Artist, MD sent at 10/09/2021  5:46 PM EST ----- Please call patient, hypercoagulation panel is negative, f/u as scheduled

## 2021-10-10 NOTE — Telephone Encounter (Signed)
Pt verbalized understanding.

## 2021-10-13 DIAGNOSIS — H43811 Vitreous degeneration, right eye: Secondary | ICD-10-CM | POA: Diagnosis not present

## 2021-10-14 DIAGNOSIS — Z1231 Encounter for screening mammogram for malignant neoplasm of breast: Secondary | ICD-10-CM | POA: Diagnosis not present

## 2021-10-20 DIAGNOSIS — Z86718 Personal history of other venous thrombosis and embolism: Secondary | ICD-10-CM | POA: Diagnosis not present

## 2021-12-05 ENCOUNTER — Other Ambulatory Visit: Payer: Self-pay

## 2021-12-05 ENCOUNTER — Encounter (HOSPITAL_COMMUNITY): Payer: Self-pay | Admitting: Emergency Medicine

## 2021-12-05 ENCOUNTER — Emergency Department (HOSPITAL_COMMUNITY): Payer: Medicare PPO

## 2021-12-05 ENCOUNTER — Emergency Department (HOSPITAL_COMMUNITY)
Admission: EM | Admit: 2021-12-05 | Discharge: 2021-12-05 | Discharge status: Against Medical Advice | Payer: Medicare PPO | Attending: Emergency Medicine | Admitting: Emergency Medicine

## 2021-12-05 DIAGNOSIS — R531 Weakness: Secondary | ICD-10-CM | POA: Diagnosis not present

## 2021-12-05 DIAGNOSIS — Z5321 Procedure and treatment not carried out due to patient leaving prior to being seen by health care provider: Secondary | ICD-10-CM | POA: Insufficient documentation

## 2021-12-05 DIAGNOSIS — Z20822 Contact with and (suspected) exposure to covid-19: Secondary | ICD-10-CM | POA: Diagnosis not present

## 2021-12-05 DIAGNOSIS — R0602 Shortness of breath: Secondary | ICD-10-CM | POA: Diagnosis not present

## 2021-12-05 DIAGNOSIS — R5383 Other fatigue: Secondary | ICD-10-CM | POA: Diagnosis not present

## 2021-12-05 DIAGNOSIS — R079 Chest pain, unspecified: Secondary | ICD-10-CM | POA: Diagnosis not present

## 2021-12-05 DIAGNOSIS — R0789 Other chest pain: Secondary | ICD-10-CM | POA: Diagnosis not present

## 2021-12-05 LAB — CBC WITH DIFFERENTIAL/PLATELET
Abs Immature Granulocytes: 0.03 10*3/uL (ref 0.00–0.07)
Basophils Absolute: 0 10*3/uL (ref 0.0–0.1)
Basophils Relative: 1 %
Eosinophils Absolute: 0.2 10*3/uL (ref 0.0–0.5)
Eosinophils Relative: 3 %
HCT: 42.5 % (ref 36.0–46.0)
Hemoglobin: 13.8 g/dL (ref 12.0–15.0)
Immature Granulocytes: 0 %
Lymphocytes Relative: 20 %
Lymphs Abs: 1.4 10*3/uL (ref 0.7–4.0)
MCH: 30.1 pg (ref 26.0–34.0)
MCHC: 32.5 g/dL (ref 30.0–36.0)
MCV: 92.8 fL (ref 80.0–100.0)
Monocytes Absolute: 0.4 10*3/uL (ref 0.1–1.0)
Monocytes Relative: 6 %
Neutro Abs: 5 10*3/uL (ref 1.7–7.7)
Neutrophils Relative %: 70 %
Platelets: 245 10*3/uL (ref 150–400)
RBC: 4.58 MIL/uL (ref 3.87–5.11)
RDW: 12.7 % (ref 11.5–15.5)
WBC: 7.1 10*3/uL (ref 4.0–10.5)
nRBC: 0 % (ref 0.0–0.2)

## 2021-12-05 LAB — COMPREHENSIVE METABOLIC PANEL
ALT: 52 U/L — ABNORMAL HIGH (ref 0–44)
AST: 41 U/L (ref 15–41)
Albumin: 4.3 g/dL (ref 3.5–5.0)
Alkaline Phosphatase: 59 U/L (ref 38–126)
Anion gap: 5 (ref 5–15)
BUN: 14 mg/dL (ref 8–23)
CO2: 26 mmol/L (ref 22–32)
Calcium: 9.6 mg/dL (ref 8.9–10.3)
Chloride: 110 mmol/L (ref 98–111)
Creatinine, Ser: 1.01 mg/dL — ABNORMAL HIGH (ref 0.44–1.00)
GFR, Estimated: 60 mL/min — ABNORMAL LOW (ref >60)
Glucose, Bld: 117 mg/dL — ABNORMAL HIGH (ref 70–99)
Potassium: 4.1 mmol/L (ref 3.5–5.1)
Sodium: 141 mmol/L (ref 135–145)
Total Bilirubin: 0.5 mg/dL (ref 0.3–1.2)
Total Protein: 6.6 g/dL (ref 6.5–8.1)

## 2021-12-05 LAB — TROPONIN I (HIGH SENSITIVITY)
Troponin I (High Sensitivity): 5 ng/L (ref <18)
Troponin I (High Sensitivity): 6 ng/L (ref <18)

## 2021-12-05 LAB — RESP PANEL BY RT-PCR (FLU A&B, COVID) ARPGX2
Influenza A by PCR: NEGATIVE
Influenza B by PCR: NEGATIVE
SARS Coronavirus 2 by RT PCR: NEGATIVE

## 2021-12-05 NOTE — ED Triage Notes (Signed)
Patient coming from home, complaint of feeling weak and tired for several days. Pt endorses being treated for PE, started to feel better but now feeling worse. VSS. Hypertensive in triage. NAD. ?

## 2021-12-05 NOTE — ED Notes (Signed)
Pt states she is leaving d/t wait time 

## 2021-12-05 NOTE — ED Provider Triage Note (Signed)
Emergency Medicine Provider Triage Evaluation Note ? ?Hanna Feit , a 71 y.o. female  was evaluated in triage.  Pt complains of fatigue, shortness of breath, chest pain, and "heart pounding."  Patient reports that she has been dealing with the symptoms intermittently over the last week.  Patient states that this feels similar to when she was diagnosed with a PE 3 months prior.  Patient has been taking her Eliquis without any missed doses. ? ?Review of Systems  ?Positive: Fatigue, shortness of breath, chest pain, tachycardia ?Negative: Lightheadedness, syncope, abdominal pain, nausea, vomiting, diaphoresis, leg swelling or tenderness ? ?Physical Exam  ?BP (!) 177/89 (BP Location: Right Arm)   Pulse 85   Temp 98.7 ?F (37.1 ?C) (Oral)   Resp 16   SpO2 97%  ?Gen:   Awake, no distress   ?Resp:  Normal effort, clear to auscultation bilaterally. ?MSK:   Moves extremities without difficulty, trace edema to bilateral lower extremities ?Other:  +2 radial pulse bilaterally ? ?Medical Decision Making  ?Medically screening exam initiated at 2:32 PM.  Appropriate orders placed.  Tahiry Kin was informed that the remainder of the evaluation will be completed by another provider, this initial triage assessment does not replace that evaluation, and the importance of remaining in the ED until their evaluation is complete. ? ? ?  ?Loni Beckwith, PA-C ?12/05/21 1433 ? ?

## 2021-12-06 DIAGNOSIS — R079 Chest pain, unspecified: Secondary | ICD-10-CM | POA: Diagnosis not present

## 2021-12-06 DIAGNOSIS — I10 Essential (primary) hypertension: Secondary | ICD-10-CM | POA: Diagnosis not present

## 2021-12-21 ENCOUNTER — Encounter: Payer: Self-pay | Admitting: Cardiovascular Disease

## 2021-12-21 ENCOUNTER — Ambulatory Visit: Payer: Medicare PPO | Admitting: Cardiovascular Disease

## 2021-12-21 DIAGNOSIS — I10 Essential (primary) hypertension: Secondary | ICD-10-CM | POA: Diagnosis not present

## 2021-12-21 DIAGNOSIS — E782 Mixed hyperlipidemia: Secondary | ICD-10-CM

## 2021-12-21 DIAGNOSIS — R0789 Other chest pain: Secondary | ICD-10-CM | POA: Diagnosis not present

## 2021-12-21 DIAGNOSIS — I82409 Acute embolism and thrombosis of unspecified deep veins of unspecified lower extremity: Secondary | ICD-10-CM | POA: Insufficient documentation

## 2021-12-21 DIAGNOSIS — I82541 Chronic embolism and thrombosis of right tibial vein: Secondary | ICD-10-CM

## 2021-12-21 DIAGNOSIS — R0602 Shortness of breath: Secondary | ICD-10-CM | POA: Insufficient documentation

## 2021-12-21 DIAGNOSIS — I2699 Other pulmonary embolism without acute cor pulmonale: Secondary | ICD-10-CM | POA: Diagnosis not present

## 2021-12-21 NOTE — Addendum Note (Signed)
Addended by: Bernita Buffy on: 12/21/2021 02:01 PM ? ? Modules accepted: Orders ? ?

## 2021-12-21 NOTE — Assessment & Plan Note (Signed)
History of essential pretension blood pressure measured today at 144/88.  She is on low-dose losartan. ?

## 2021-12-21 NOTE — Assessment & Plan Note (Signed)
History of atypical chest pain without cardiac risk factors.  Her CTA performed 09/02/2021 did not mention coronary calcification.  I am getting a coronary calcium score to further evaluate. ?

## 2021-12-21 NOTE — Progress Notes (Signed)
? ? ? ?12/21/2021 ?Georgette Shell   ?06-01-1951  ?[id number] ? ?Primary Physician Darrow Bussing, MD ?Primary Cardiologist: [name] MD Nicholes Calamity, [place] ? ?HPI:  Samantha Steele is a 71 y.o. 71 year old married Caucasian female mother of 3 children referred by her PCP, Dr. Docia Chuck, for evaluation of atypical chest pain and shortness of breath.  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  Her husband Gerlene Burdock accompanies her.  He is a Music therapist and a Education administrator.  Her cardiac risk factors notable for treated hypertension hyperlipidemia.  There is no family history of heart disease.  She is never had a heart attack or stroke.  She did have COVID over Christmas along with her husband.  She was fully vaccinated and boosted prior to that.  She developed a DVT and submassive pulmonary embolism 09/02/2021 has been on Eliquis ever since.  Her husband interestingly simultaneously had a stroke.  Over the last 2 weeks she has noticed some slight relapse in her symptoms with some shortness of breath and atypical chest pain. ? ? ?Current Meds  ?Medication Sig  ? acetaminophen (TYLENOL) 500 MG tablet Take 500 mg by mouth every 6 (six) hours as needed for moderate pain.  ? apixaban (ELIQUIS) 5 MG TABS tablet Take 2 tablets (10 mg) twice daily,and on 09/10/21 switch to 1 tablet (5 mg) twice twice daily.  ? calcium carbonate (OSCAL) 1500 (600 Ca) MG TABS tablet Take 1,500 mg by mouth daily.  ? Cholecalciferol (VITAMIN D3) 50 MCG (2000 UT) capsule Take 2,000 Units by mouth daily.  ? estradiol (ESTRACE) 0.1 MG/GM vaginal cream Place 1 Applicatorful vaginally once a week.  ? losartan (COZAAR) 25 MG tablet Take 25 mg by mouth daily.  ? rosuvastatin (CRESTOR) 5 MG tablet Take 2.5 mg by mouth every Monday, Wednesday, and Friday.  ?  ? ?Allergies  ?Allergen Reactions  ? Ampicillin Rash  ? Erythromycin Hives  ? Statins Other (See Comments)  ?  Foggy head  ? Alendronate Sodium   ?  Other reaction(s): jaw pain  ?  Venlafaxine   ?  Other reaction(s): non-therpeutic  ? ? ?Social History  ? ?Socioeconomic History  ? Marital status: Married  ?  Spouse name: Not on file  ? Number of children: Not on file  ? Years of education: Not on file  ? Highest education level: Not on file  ?Occupational History  ? Not on file  ?Tobacco Use  ? Smoking status: Never  ? Smokeless tobacco: Never  ?Vaping Use  ? Vaping Use: Never used  ?Substance and Sexual Activity  ? Alcohol use: No  ? Drug use: No  ? Sexual activity: Yes  ?  Partners: Male  ?  Birth control/protection: Post-menopausal  ?Other Topics Concern  ? Not on file  ?Social History Narrative  ? Not on file  ? ?Social Determinants of Health  ? ?Financial Resource Strain: Not on file  ?Food Insecurity: Not on file  ?Transportation Needs: Not on file  ?Physical Activity: Not on file  ?Stress: Not on file  ?Social Connections: Not on file  ?Intimate Partner Violence: Not on file  ?  ? ?Review of Systems: ?General: negative for chills, fever, night sweats or weight changes.  ?Cardiovascular: negative for chest pain, dyspnea on exertion, edema, orthopnea, palpitations, paroxysmal nocturnal dyspnea or shortness of breath ?Dermatological: negative for rash ?Respiratory: negative for cough or wheezing ?Urologic: negative for hematuria ?Abdominal: negative for nausea, vomiting, diarrhea, bright red blood  per rectum, melena, or hematemesis ?Neurologic: negative for visual changes, syncope, or dizziness ?All other systems reviewed and are otherwise negative except as noted above. ? ? ? ?Blood pressure (!) 144/88, pulse 80, height 5\' 3"  (1.6 m), weight 147 lb 9.6 oz (67 kg), SpO2 97 %.  ?General appearance: alert and no distress ?Neck: no adenopathy, no carotid bruit, no JVD, supple, symmetrical, trachea midline, and thyroid not enlarged, symmetric, no tenderness/mass/nodules ?Lungs: clear to auscultation bilaterally ?Heart: regular rate and rhythm, S1, S2 normal, no murmur, click, rub or  gallop ?Extremities: extremities normal, atraumatic, no cyanosis or edema ?Pulses: 2+ and symmetric ?Skin: Skin color, texture, turgor normal. No rashes or lesions ?Neurologic: Grossly normal ? ?EKG not performed today ? ?ASSESSMENT AND PLAN:  ? ?Submassive acute pulmonary embolism with acute DVT involving the right peroneal vein ?History of submassive pulmonary embolus in January after having had COVID at Christmas.  She did not have right heart strain.  She did have a right peroneal vein DVT simultaneously was placed on Eliquis oral anticoagulation.  Her 2D echo was normal. ? ?Essential hypertension ?History of essential pretension blood pressure measured today at 144/88.  She is on low-dose losartan. ? ?Hyperlipidemia ?History of hyperlipidemia on statin therapy with lipid profile performed 09/02/2021 revealing total cholesterol 192, LDL 105 and HDL 64. ? ?Atypical chest pain ?History of atypical chest pain without cardiac risk factors.  Her CTA performed 09/02/2021 did not mention coronary calcification.  I am getting a coronary calcium score to further evaluate. ? ? ? ? ?09/04/2021 MD FACP,FACC,FAHA, FSCAI ?12/21/2021 ?1:47 PM ?

## 2021-12-21 NOTE — Assessment & Plan Note (Signed)
History of submassive pulmonary embolus in January after having had COVID at Christmas.  She did not have right heart strain.  She did have a right peroneal vein DVT simultaneously was placed on Eliquis oral anticoagulation.  Her 2D echo was normal. ?

## 2021-12-21 NOTE — Patient Instructions (Signed)
Medication Instructions:  ?Your physician recommends that you continue on your current medications as directed. Please refer to the Current Medication list given to you today. ? ?*If you need a refill on your cardiac medications before your next appointment, please call your pharmacy* ? ? ?Testing/Procedures: ?Your physician has requested that you have a lower extremity venous duplex. This test is an ultrasound of the veins in the legs. It looks at venous blood flow that carries blood from the heart to the legs. Allow one hour for a Lower Venous exam. There are no restrictions or special instructions. This procedure will be done at [facility] ? ? ?Your physician has requested that you have an echocardiogram. Echocardiography is a painless test that uses sound waves to create images of your heart. It provides your doctor with information about the size and shape of your heart and how well your heart?s chambers and valves are working. This procedure takes approximately one hour. There are no restrictions for this procedure. ?Test locations:  ?[facility] ([address] [place], [place] [address]) ?[facility] ([address] [place], [place] [address]) ? ? ? ? ?Dr. Gwenlyn Found  has ordered a CT coronary calcium score.  ? ?Test locations:  ?[facility] ([address] [place], [place] [address]) ?[facility] ([address] [place],  [address])  ? ?This is $99 out of pocket. ? ? ?Coronary CalciumScan ?A coronary calcium scan is an imaging test used to look for deposits of calcium and other fatty materials (plaques) in the inner lining of the blood vessels of the heart (coronary arteries). These deposits of calcium and plaques can partly clog and narrow the coronary arteries without producing any symptoms or warning signs. This puts a person at risk for a heart attack. This test can detect these deposits before symptoms develop. ?Tell a health care provider  about: ?Any allergies you have. ?All medicines you are taking, including vitamins, herbs, eye drops, creams, and over-the-counter medicines. ?Any problems you or family members have had with anesthetic medicines. ?Any blood disorders you have. ?Any surgeries you have had. ?Any medical conditions you have. ?Whether you are pregnant or may be pregnant. ?What are the risks? ?Generally, this is a safe procedure. However, problems may occur, including: ?Harm to a pregnant woman and her unborn baby. This test involves the use of radiation. Radiation exposure can be dangerous to a pregnant woman and her unborn baby. If you are pregnant, you generally should not have this procedure done. ?Slight increase in the risk of cancer. This is because of the radiation involved in the test. ?What happens before the procedure? ?No preparation is needed for this procedure. ?What happens during the procedure? ?You will undress and remove any jewelry around your neck or chest. ?You will put on a hospital gown. ?Sticky electrodes will be placed on your chest. The electrodes will be connected to an electrocardiogram (ECG) machine to record a tracing of the electrical activity of your heart. ?A CT scanner will take pictures of your heart. During this time, you will be asked to lie still and hold your breath for 2-3 seconds while a picture of your heart is being taken. ?The procedure may vary among health care providers and hospitals. ?What happens after the procedure? ?You can get dressed. ?You can return to your normal activities. ?It is up to you to get the results of your test. Ask your health care provider, or the department that is doing the test,  when your results will be ready. ?Summary ?A coronary calcium scan is an imaging test used to look for deposits of calcium and other fatty materials (plaques) in the inner lining of the blood vessels of the heart (coronary arteries). ?Generally, this is a safe procedure. Tell your health care  provider if you are pregnant or may be pregnant. ?No preparation is needed for this procedure. ?A CT scanner will take pictures of your heart. ?You can return to your normal activities after the scan is done. ?This information is not intended to replace advice given to you by your health care provider. Make sure you discuss any questions you have with your health care provider. ?Document Released: 01/27/2008 Document Revised: 06/19/2016 Document Reviewed: 06/19/2016 ?Elsevier Interactive Patient Education ? 2017 Elsevier Inc. ? ? ? ? ?Follow-Up: ?At [facility], you and your health needs are our priority.  As part of our continuing mission to provide you with exceptional heart care, we have created designated Provider Care Teams.  These Care Teams include your primary Cardiologist (physician) and Advanced Practice Providers (APPs -  Physician Assistants and Nurse Practitioners) who all work together to provide you with the care you need, when you need it. ? ?We recommend signing up for the patient portal called "MyChart".  Sign up information is provided on this After Visit Summary.  MyChart is used to connect with patients for Virtual Visits (Telemedicine).  Patients are able to view lab/test results, encounter notes, upcoming appointments, etc.  Non-urgent messages can be sent to your provider as well.   ?To learn more about what you can do with MyChart, go to [email or web address].   ? ?Your next appointment:   ?6-8 week(s) ? ?The format for your next appointment:   ?In Person ? ?Provider:   ?Quay Burow, MD ?

## 2021-12-21 NOTE — Assessment & Plan Note (Signed)
History of hyperlipidemia on statin therapy with lipid profile performed 09/02/2021 revealing total cholesterol 192, LDL 105 and HDL 64. ?

## 2022-01-02 ENCOUNTER — Ambulatory Visit (INDEPENDENT_AMBULATORY_CARE_PROVIDER_SITE_OTHER): Payer: Medicare PPO

## 2022-01-02 ENCOUNTER — Ambulatory Visit (HOSPITAL_BASED_OUTPATIENT_CLINIC_OR_DEPARTMENT_OTHER): Admission: RE | Admit: 2022-01-02 | Payer: Medicare PPO | Source: Ambulatory Visit

## 2022-01-02 ENCOUNTER — Ambulatory Visit (HOSPITAL_BASED_OUTPATIENT_CLINIC_OR_DEPARTMENT_OTHER): Payer: Medicare PPO

## 2022-01-02 DIAGNOSIS — I82541 Chronic embolism and thrombosis of right tibial vein: Secondary | ICD-10-CM

## 2022-01-02 DIAGNOSIS — R0789 Other chest pain: Secondary | ICD-10-CM | POA: Diagnosis not present

## 2022-01-02 DIAGNOSIS — I2699 Other pulmonary embolism without acute cor pulmonale: Secondary | ICD-10-CM

## 2022-01-02 DIAGNOSIS — R0602 Shortness of breath: Secondary | ICD-10-CM | POA: Diagnosis not present

## 2022-01-02 DIAGNOSIS — E782 Mixed hyperlipidemia: Secondary | ICD-10-CM | POA: Diagnosis not present

## 2022-01-02 DIAGNOSIS — I10 Essential (primary) hypertension: Secondary | ICD-10-CM | POA: Diagnosis not present

## 2022-01-02 LAB — ECHOCARDIOGRAM COMPLETE
AR max vel: 1.98 cm2
AV Area mean vel: 1.74 cm2
AV Peak grad: 6 mmHg
Ao pk vel: 1.22 m/s
S' Lateral: 2.51 cm

## 2022-01-10 ENCOUNTER — Other Ambulatory Visit: Payer: Self-pay

## 2022-01-10 ENCOUNTER — Emergency Department (HOSPITAL_COMMUNITY): Payer: Medicare PPO

## 2022-01-10 ENCOUNTER — Emergency Department (HOSPITAL_COMMUNITY)
Admission: EM | Admit: 2022-01-10 | Discharge: 2022-01-10 | Disposition: A | Discharge status: Home / Self Care | Payer: Medicare PPO | Attending: Emergency Medicine | Admitting: Emergency Medicine

## 2022-01-10 ENCOUNTER — Encounter (HOSPITAL_COMMUNITY): Payer: Self-pay

## 2022-01-10 DIAGNOSIS — R072 Precordial pain: Secondary | ICD-10-CM | POA: Diagnosis not present

## 2022-01-10 DIAGNOSIS — R55 Syncope and collapse: Secondary | ICD-10-CM | POA: Diagnosis not present

## 2022-01-10 DIAGNOSIS — I2510 Atherosclerotic heart disease of native coronary artery without angina pectoris: Secondary | ICD-10-CM | POA: Diagnosis not present

## 2022-01-10 DIAGNOSIS — R002 Palpitations: Secondary | ICD-10-CM | POA: Insufficient documentation

## 2022-01-10 DIAGNOSIS — R0789 Other chest pain: Secondary | ICD-10-CM | POA: Diagnosis not present

## 2022-01-10 DIAGNOSIS — R079 Chest pain, unspecified: Secondary | ICD-10-CM

## 2022-01-10 DIAGNOSIS — N189 Chronic kidney disease, unspecified: Secondary | ICD-10-CM | POA: Diagnosis not present

## 2022-01-10 DIAGNOSIS — I129 Hypertensive chronic kidney disease with stage 1 through stage 4 chronic kidney disease, or unspecified chronic kidney disease: Secondary | ICD-10-CM | POA: Diagnosis not present

## 2022-01-10 DIAGNOSIS — Z79899 Other long term (current) drug therapy: Secondary | ICD-10-CM | POA: Insufficient documentation

## 2022-01-10 DIAGNOSIS — Z7901 Long term (current) use of anticoagulants: Secondary | ICD-10-CM | POA: Insufficient documentation

## 2022-01-10 DIAGNOSIS — R0602 Shortness of breath: Secondary | ICD-10-CM | POA: Insufficient documentation

## 2022-01-10 LAB — CBC
HCT: 43.4 % (ref 36.0–46.0)
Hemoglobin: 14.1 g/dL (ref 12.0–15.0)
MCH: 30 pg (ref 26.0–34.0)
MCHC: 32.5 g/dL (ref 30.0–36.0)
MCV: 92.3 fL (ref 80.0–100.0)
Platelets: 229 10*3/uL (ref 150–400)
RBC: 4.7 MIL/uL (ref 3.87–5.11)
RDW: 11.9 % (ref 11.5–15.5)
WBC: 5.3 10*3/uL (ref 4.0–10.5)
nRBC: 0 % (ref 0.0–0.2)

## 2022-01-10 LAB — BASIC METABOLIC PANEL
Anion gap: 8 (ref 5–15)
BUN: 16 mg/dL (ref 8–23)
CO2: 26 mmol/L (ref 22–32)
Calcium: 9.6 mg/dL (ref 8.9–10.3)
Chloride: 104 mmol/L (ref 98–111)
Creatinine, Ser: 1.08 mg/dL — ABNORMAL HIGH (ref 0.44–1.00)
GFR, Estimated: 55 mL/min — ABNORMAL LOW (ref >60)
Glucose, Bld: 100 mg/dL — ABNORMAL HIGH (ref 70–99)
Potassium: 4.2 mmol/L (ref 3.5–5.1)
Sodium: 138 mmol/L (ref 135–145)

## 2022-01-10 LAB — TROPONIN I (HIGH SENSITIVITY)
Troponin I (High Sensitivity): 5 ng/L (ref <18)
Troponin I (High Sensitivity): 4 ng/L (ref <18)

## 2022-01-10 MED ORDER — IOHEXOL 350 MG/ML SOLN
100.0000 mL | Freq: Once | INTRAVENOUS | Status: AC | PRN
  Administered 2022-01-10: 100 mL via INTRAVENOUS

## 2022-01-10 NOTE — ED Notes (Signed)
[facility] team at bedside to place IV for CT study.

## 2022-01-10 NOTE — ED Provider Triage Note (Signed)
Emergency Medicine Provider Triage Evaluation Note  Samantha Steele , a 71 y.o. female  was evaluated in triage.  Pt complains of intermittent shortness of breath, palpitations, and lightheadedness over the last 2 weeks.  Recovering from PE in January.  Still taking Eliquis twice daily.  No Hx of prior MI or COPD.  Denies syncope, chest pain, N/V/D, abdominal pain, headache, vision changes.  Denies cough or recent upper respiratory infection.  Hx includes HTN, hyperlipidemia, CKD, CAD, osteopenia.  Review of Systems  Positive:  Negative: As above  Physical Exam  BP (!) 160/84 (BP Location: Right Arm)   Pulse 78   Temp 99 F (37.2 C) (Oral)   Resp 16   SpO2 97%  Gen:   Awake, no distress   Resp:  Normal effort, CTAB, able to communicate without difficulty MSK:   Moves extremities without difficulty  Other:  RRR without M/R/G.  Abdomen soft nontender.  No swelling of the lower extremities.  Radial, DP, PT pulses 2+ bilaterally.  Chest non-TTP.  Medical Decision Making  Medically screening exam initiated at 3:33 PM.  Appropriate orders placed.  Samantha Steele was informed that the remainder of the evaluation will be completed by another provider, this initial triage assessment does not replace that evaluation, and the importance of remaining in the ED until their evaluation is complete.  Labs, imaging, EKG ordered   Samantha Steele, [place] 01/10/22 1541

## 2022-01-10 NOTE — ED Triage Notes (Signed)
Pt here for shob, lightheadedness and "chest pounding" that has been intermittent x2 weeks. Pt reports today she has had multiple episodes of chest pounding, shortness of breath at rest and feeling like she is going to pass out. Pt has hx of PE in January

## 2022-01-10 NOTE — Discharge Instructions (Addendum)
Your workup was overall reassuring in the ED today without any acute findings.   Please follow up with both Dr. Allyson Sabal as well as your PCP regarding your intermittent symptoms.   Continue taking your Eliquis as prescribed.   Return to the ED for any new/worsening symptoms.

## 2022-01-10 NOTE — ED Notes (Signed)
Attempted to stick the patient x2 for peripheral IV without success.

## 2022-01-10 NOTE — ED Provider Notes (Signed)
[facility] EMERGENCY DEPARTMENT Provider Note   CSN: [id number] Arrival date & time: 01/10/22  1309     History  Chief Complaint  Patient presents with   Shortness of Breath   Chest Pain    Samantha Steele is a 71 y.o. female with PMHx HTN, HLD, CKD, CAD, DVT/Submassive PE on Eliquis who presents to the ED today with complaint of intermittent episodes of substernal chest pain,  heart palpitations, shortness of breath, and lightheadedness. She reports that after having her PE in January she was getting better however about 1 month ago started noticing intermittent episodes similar to when she had a PE. She saw her cardiologist Dr. Allyson Sabal for same who repeated DVT study bilaterally without new/worsening DVT and had an ECHO done without acute findings. Per chart review does appear Dr. Allyson Sabal also ordered a CT cardiac scoring however pt had not heard anything about that appointment/to get it done and thinks insurance may not have covered it. She reports today she had more frequent episodes causing concern and prompting ED visit.   The history is provided by the patient and medical records.      Home Medications Prior to Admission medications   Medication Sig Start Date End Date Taking? Authorizing Provider  acetaminophen (TYLENOL) 500 MG tablet Take 500 mg by mouth every 6 (six) hours as needed for moderate pain.    [provider]  apixaban (ELIQUIS) 5 MG TABS tablet Take 2 tablets (10 mg) twice daily,and on 09/10/21 switch to 1 tablet (5 mg) twice twice daily. 09/04/21   Ghimire, Werner Lean, MD  calcium carbonate (OSCAL) 1500 (600 Ca) MG TABS tablet Take 1,500 mg by mouth daily.    [provider]  Cholecalciferol (VITAMIN D3) 50 MCG (2000 UT) capsule Take 2,000 Units by mouth daily.    [provider]  estradiol (ESTRACE) 0.1 MG/GM vaginal cream Place 1 Applicatorful vaginally once a week. 04/25/21   [provider]  loratadine (CLARITIN)  10 MG tablet 1 tablet    [provider]  losartan (COZAAR) 25 MG tablet Take 25 mg by mouth daily.    [provider]  rosuvastatin (CRESTOR) 5 MG tablet Take 2.5 mg by mouth every Monday, Wednesday, and Friday. 05/28/21   [provider]      Allergies    Ampicillin, Erythromycin, Statins, Alendronate sodium, and Venlafaxine    Review of Systems   Review of Systems  Constitutional:  Negative for chills and fever.  Respiratory:  Positive for shortness of breath.   Cardiovascular:  Positive for chest pain and palpitations. Negative for leg swelling.  Neurological:  Positive for light-headedness. Negative for dizziness and syncope.  All other systems reviewed and are negative.  Physical Exam Updated Vital Signs BP (!) 153/76   Pulse 84   Temp 99 F (37.2 C) (Oral)   Resp 13   SpO2 97%  Physical Exam Vitals and nursing note reviewed.  Constitutional:      Appearance: She is not ill-appearing or diaphoretic.  HENT:     Head: Normocephalic and atraumatic.  Eyes:     Conjunctiva/sclera: Conjunctivae normal.  Cardiovascular:     Rate and Rhythm: Normal rate and regular rhythm.  Pulmonary:     Effort: Pulmonary effort is normal.     Breath sounds: Normal breath sounds. No decreased breath sounds, wheezing, rhonchi or rales.  Abdominal:     Palpations: Abdomen is soft.     Tenderness: There is no abdominal  tenderness.  Musculoskeletal:     Cervical back: Neck supple.     Right lower leg: No edema.     Left lower leg: No edema.  Skin:    General: Skin is warm and dry.  Neurological:     Mental Status: She is alert.    ED Results / Procedures / Treatments   Labs (all labs ordered are listed, but only abnormal results are displayed) Labs Reviewed  BASIC METABOLIC PANEL - Abnormal; Notable for the following components:      Result Value   Glucose, Bld 100 (*)    Creatinine, Ser 1.08 (*)    GFR, Estimated 55 (*)    All other components within  normal limits  CBC  TROPONIN I (HIGH SENSITIVITY)  TROPONIN I (HIGH SENSITIVITY)    EKG None  Radiology DG Chest 2 View  Result Date: 01/10/2022 CLINICAL DATA:  Chest pain. EXAM: CHEST - 2 VIEW COMPARISON:  Chest radiograph December 05, 2021. FINDINGS: No consolidation. No visible pleural effusions or pneumothorax. Cardiomediastinal silhouette is within normal limits. No displaced fracture identified. IMPRESSION: No evidence of acute cardiopulmonary disease. Electronically Signed   By: Feliberto Harts M.D.   On: 01/10/2022 14:05   CT Angio Chest PE W/Cm &/Or Wo Cm  Result Date: 01/10/2022 CLINICAL DATA:  Intermittent chest pain for 2 weeks, shortness of breath, presyncope EXAM: CT ANGIOGRAPHY CHEST WITH CONTRAST TECHNIQUE: Multidetector CT imaging of the chest was performed using the standard protocol during bolus administration of intravenous contrast. Multiplanar CT image reconstructions and MIPs were obtained to evaluate the vascular anatomy. RADIATION DOSE REDUCTION: This exam was performed according to the departmental dose-optimization program which includes automated exposure control, adjustment of the mA and/or kV according to patient size and/or use of iterative reconstruction technique. CONTRAST:  OMNIPAQUE IOHEXOL 350 MG/ML SOLN COMPARISON:  09/02/2021 FINDINGS: Cardiovascular: This is a technically adequate evaluation of the pulmonary vasculature. No filling defects or pulmonary emboli. The heart is unremarkable without pericardial effusion. No evidence of thoracic aortic aneurysm or dissection. Mediastinum/Nodes: No enlarged mediastinal, hilar, or axillary lymph nodes. Thyroid gland, trachea, and esophagus demonstrate no significant findings. Lungs/Pleura: Dependent hypoventilatory changes within the lower lobes. No acute airspace disease, effusion, or pneumothorax. Central airways are patent. Upper Abdomen: No acute abnormality. Musculoskeletal: No acute or destructive bony  lesions. Reconstructed images demonstrate no additional findings. Review of the MIP images confirms the above findings. IMPRESSION: 1. No evidence of pulmonary embolus. 2. No acute intrathoracic process. Electronically Signed   By: Sharlet Salina M.D.   On: 01/10/2022 20:31    Procedures Procedures    Medications Ordered in ED Medications  iohexol (OMNIPAQUE) 350 MG/ML injection 100 mL (100 mLs Intravenous Contrast Given 01/10/22 2023)    ED Course/ Medical Decision Making/ A&P                           Medical Decision Making 71 year old female who presents to the ED today with complaint of intermittent episodes of substernal chest pain, shortness of breath, heart palpitations, lightheadedness for the past month however more frequent and worsening episodes today.  History of submassive PE and right DVT in January, currently on Eliquis and has been compliant with same.  On arrival to the ED today vitals are stable.  Work-up started including chest x-ray without acute findings as well as troponin of 5 and repeat of 4, stable.  CBC and BMP without acute electrolyte abnormalities and  hemoglobin 14.1 without signs of anemia.  Patient did see cardiology approximately 2 weeks ago and had repeat DVT study without acute findings.  She also had repeat echo without acute findings.  Does appear that she was scheduled to have a CT coronary score however had not not had this done yet and believes that insurance was not going to cover it.  Given history of PE with intermittent episodes of palpitations, shortness of breath, chest pain we will proceed with CTA at this time for further eval.  If no acute findings patient ultimately will likely need to follow back up with cardiology for additional evaluation.  CTA negative for acute findings  Workup overall reassuring. Pt to be discharged home at this time with close outpatient cardiology follow up. Pt is in agreement with plan and stable for discharge home.    Amount and/or Complexity of Data Reviewed Labs: ordered. Decision-making details documented in ED Course. Radiology: ordered. Decision-making details documented in ED Course.  Risk Prescription drug management.          Final Clinical Impression(s) / ED Diagnoses Final diagnoses:  Palpitations  Nonspecific chest pain    Rx / DC Orders ED Discharge Orders     None        Discharge Instructions      Your workup was overall reassuring in the ED today without any acute findings.   Please follow up with both Dr. Allyson SabalBerry as well as your PCP regarding your intermittent symptoms.   Continue taking your Eliquis as prescribed.   Return to the ED for any new/worsening symptoms.        Tanda RockersVenter, Baraka Klatt, PA-C 01/10/22 2053    Ernie AvenaLawsing, James, MD 01/11/22 0100

## 2022-01-11 ENCOUNTER — Encounter: Payer: Self-pay | Admitting: Cardiovascular Disease

## 2022-01-12 DIAGNOSIS — D6859 Other primary thrombophilia: Secondary | ICD-10-CM | POA: Diagnosis not present

## 2022-01-12 DIAGNOSIS — I10 Essential (primary) hypertension: Secondary | ICD-10-CM | POA: Diagnosis not present

## 2022-01-16 ENCOUNTER — Ambulatory Visit (HOSPITAL_BASED_OUTPATIENT_CLINIC_OR_DEPARTMENT_OTHER)
Admission: RE | Admit: 2022-01-16 | Discharge: 2022-01-16 | Disposition: A | Discharge status: Home / Self Care | Payer: Medicare PPO | Source: Ambulatory Visit | Attending: Cardiovascular Disease | Admitting: Cardiovascular Disease

## 2022-01-16 DIAGNOSIS — I10 Essential (primary) hypertension: Secondary | ICD-10-CM | POA: Insufficient documentation

## 2022-01-16 DIAGNOSIS — E782 Mixed hyperlipidemia: Secondary | ICD-10-CM | POA: Insufficient documentation

## 2022-01-18 NOTE — Telephone Encounter (Signed)
Spoke with pt regarding return office visit with Dr. Allyson Sabal. Office visit scheduled and pt verbalizes understanding.

## 2022-01-30 ENCOUNTER — Inpatient Hospital Stay: Payer: Medicare PPO | Attending: Oncology | Admitting: Oncology

## 2022-01-30 VITALS — BP 151/78 | HR 77 | Temp 98.2°F | Resp 18 | Ht 63.0 in | Wt 150.6 lb

## 2022-01-30 DIAGNOSIS — I10 Essential (primary) hypertension: Secondary | ICD-10-CM | POA: Insufficient documentation

## 2022-01-30 DIAGNOSIS — R5381 Other malaise: Secondary | ICD-10-CM | POA: Diagnosis not present

## 2022-01-30 DIAGNOSIS — Z7901 Long term (current) use of anticoagulants: Secondary | ICD-10-CM | POA: Diagnosis not present

## 2022-01-30 DIAGNOSIS — Z86718 Personal history of other venous thrombosis and embolism: Secondary | ICD-10-CM | POA: Diagnosis not present

## 2022-01-30 DIAGNOSIS — Z86711 Personal history of pulmonary embolism: Secondary | ICD-10-CM | POA: Insufficient documentation

## 2022-01-30 DIAGNOSIS — I2699 Other pulmonary embolism without acute cor pulmonale: Secondary | ICD-10-CM | POA: Diagnosis not present

## 2022-01-30 NOTE — Progress Notes (Signed)
  [facility] OFFICE PROGRESS NOTE   Diagnosis: DVT/pulmonary embolism  INTERVAL HISTORY:   Samantha Steele returns as scheduled.  She continues apixaban.  She had recurrent dyspnea last month.  A CT of the chest 01/10/2022 revealed no pulmonary embolism or acute intrathoracic process.  Lower extremity Dopplers 01/02/2022 were negative for DVT. She has a mild residual cough following the pulmonary embolism earlier this year.  She has malaise.  No other complaint.  Objective:  Vital signs in last 24 hours:  Blood pressure (!) 151/78, pulse 77, temperature 98.2 F (36.8 C), temperature source Oral, resp. rate 18, height 5\' 3"  (1.6 m), weight 150 lb 9.6 oz (68.3 kg), SpO2 97 %.    Resp: End inspiratory fine rales at the lower posterior chest bilaterally, no respiratory distress Cardio: Regular rate and rhythm GI: No hepatosplenomegaly Vascular: No leg edema   Lab Results:  Lab Results  Component Value Date   WBC 5.3 01/10/2022   HGB 14.1 01/10/2022   HCT 43.4 01/10/2022   MCV 92.3 01/10/2022   PLT 229 01/10/2022   NEUTROABS 5.0 12/05/2021    CMP  Lab Results  Component Value Date   NA 138 01/10/2022   K 4.2 01/10/2022   CL 104 01/10/2022   CO2 26 01/10/2022   GLUCOSE 100 (H) 01/10/2022   BUN 16 01/10/2022   CREATININE 1.08 (H) 01/10/2022   CALCIUM 9.6 01/10/2022   PROT 6.6 12/05/2021   ALBUMIN 4.3 12/05/2021   AST 41 12/05/2021   ALT 52 (H) 12/05/2021   ALKPHOS 59 12/05/2021   BILITOT 0.5 12/05/2021   GFRNONAA 55 (L) 01/10/2022    Medications: I have reviewed the patient's current medications.   Assessment/Plan: Right lower extremity deep vein thrombosis and bilateral pulmonary embolism CT chest 09/02/2021-bilateral pulmonary embolism with right heart strain Right peroneal DVT 09/03/2021 Admission 09/02/2021-heparin, transition to apixaban 09/04/2021 Lower extremity Dopplers 01/02/2022-negative for DVT CT chest 01/10/2022-negative for pulmonary  embolism History of cough and exertional dyspnea secondary to #1 Hypertension     Disposition: Samantha Steele appears stable.  She continues apixaban anticoagulation.  She had a DVT/pulmonary embolism in January without a clear predisposing risk factor.  She had "cold "symptoms in December 2022 and other family members were diagnosed with COVID-19.  It is possible she had undocumented COVID-19 in December 2022 and this led to the venous thromboembolism.  She will continue apixaban anticoagulation.  The plan is to hold apixaban for 3 days beginning 03/03/2022.  She will return for a D-dimer and antiphospholipid panel on 03/06/2022.  She will resume apixaban 03/06/2022.  Samantha Steele will be seen for an office visit on 03/10/2022.  We will discuss the indication for continuing indefinite anticoagulation therapy at the next office visit.  03/12/2022, MD  01/30/2022  11:53 AM

## 2022-01-31 ENCOUNTER — Encounter: Payer: Self-pay | Admitting: Cardiovascular Disease

## 2022-01-31 ENCOUNTER — Ambulatory Visit: Payer: Medicare PPO | Admitting: Cardiovascular Disease

## 2022-01-31 VITALS — BP 132/74 | HR 84 | Ht 63.5 in | Wt 149.6 lb

## 2022-01-31 DIAGNOSIS — R0789 Other chest pain: Secondary | ICD-10-CM | POA: Diagnosis not present

## 2022-01-31 NOTE — Progress Notes (Signed)
Samantha Steele returns today for follow-up of her outpatient diagnostic test done because of chest pain and shortness of breath.  Her 2D echo was entirely normal.  Her coronary calcium score was 11.  Her venous Dopplers show resolution of her previously demonstrated peroneal vein DVT in January.  She remains on Eliquis although I am told that her hematologist oncologist wants to stop that next month and reevaluate.  Her symptoms for which she was emergently sent to me for have resolved.  No further work-up is necessary.  I will see her back as needed.  [name], M.D., FACP, [facility], [facility] [address] [place], [place]  [address]  [phone] 01/31/2022 1:39 PM

## 2022-01-31 NOTE — Patient Instructions (Signed)

## 2022-02-16 DIAGNOSIS — N898 Other specified noninflammatory disorders of vagina: Secondary | ICD-10-CM | POA: Diagnosis not present

## 2022-02-16 DIAGNOSIS — N952 Postmenopausal atrophic vaginitis: Secondary | ICD-10-CM | POA: Diagnosis not present

## 2022-03-06 ENCOUNTER — Inpatient Hospital Stay: Payer: Medicare PPO | Attending: Oncology

## 2022-03-06 DIAGNOSIS — Z86718 Personal history of other venous thrombosis and embolism: Secondary | ICD-10-CM | POA: Diagnosis not present

## 2022-03-06 DIAGNOSIS — I10 Essential (primary) hypertension: Secondary | ICD-10-CM | POA: Diagnosis not present

## 2022-03-06 DIAGNOSIS — Z86711 Personal history of pulmonary embolism: Secondary | ICD-10-CM | POA: Insufficient documentation

## 2022-03-06 DIAGNOSIS — I2699 Other pulmonary embolism without acute cor pulmonale: Secondary | ICD-10-CM

## 2022-03-06 LAB — D-DIMER, QUANTITATIVE: D-Dimer, Quant: 0.29 ug/mL-FEU (ref 0.00–0.50)

## 2022-03-07 ENCOUNTER — Telehealth: Payer: Self-pay

## 2022-03-07 ENCOUNTER — Other Ambulatory Visit: Payer: Self-pay

## 2022-03-07 NOTE — Telephone Encounter (Signed)
Call from lab. Unable to process the Lupus Anticoagulation. Needs to be redrawn, Patient has an appointment with Dr Truett Perna Friday.   Discussed with Dr Truett Perna. Hold Eliquis today and tomorrow. Will redraw at visit Friday and still see her.  Left message on voicemail to return call

## 2022-03-07 NOTE — Telephone Encounter (Signed)
Follow up call to Pt from earlier message left in reference to Lab test. Spoke with Pt informed her that one of the labs was not able to be processed. And per Dr Truett Perna she is to hold Eliquis today and tomorrow  and labs will be drawn at visit on Friday. Pt verbalized understanding.

## 2022-03-09 DIAGNOSIS — N952 Postmenopausal atrophic vaginitis: Secondary | ICD-10-CM | POA: Diagnosis not present

## 2022-03-10 ENCOUNTER — Inpatient Hospital Stay: Payer: Medicare PPO | Admitting: Oncology

## 2022-03-10 ENCOUNTER — Other Ambulatory Visit: Payer: Self-pay

## 2022-03-10 ENCOUNTER — Inpatient Hospital Stay: Payer: Medicare PPO

## 2022-03-10 VITALS — BP 150/83 | HR 70 | Temp 98.2°F | Resp 18 | Ht 63.5 in | Wt 149.8 lb

## 2022-03-10 DIAGNOSIS — I2699 Other pulmonary embolism without acute cor pulmonale: Secondary | ICD-10-CM

## 2022-03-10 DIAGNOSIS — Z86718 Personal history of other venous thrombosis and embolism: Secondary | ICD-10-CM | POA: Diagnosis not present

## 2022-03-10 DIAGNOSIS — Z86711 Personal history of pulmonary embolism: Secondary | ICD-10-CM | POA: Diagnosis not present

## 2022-03-10 DIAGNOSIS — I10 Essential (primary) hypertension: Secondary | ICD-10-CM | POA: Diagnosis not present

## 2022-03-10 NOTE — Progress Notes (Signed)
  [facility] OFFICE PROGRESS NOTE   Diagnosis: DVT/pulmonary embolism  INTERVAL HISTORY:   Samantha Steele returns as scheduled.  She continues apixaban anticoagulation.  No bleeding.  No symptom of recurrent DVT or pulmonary embolism.  She feels well.  Good appetite.  No difficulty with bowel or bladder function.  Objective:  Vital signs in last 24 hours:  Blood pressure (!) 150/83, pulse 70, temperature 98.2 F (36.8 C), temperature source Oral, resp. rate 18, height 5' 3.5" (1.613 m), weight 149 lb 12.8 oz (67.9 kg), SpO2 98 %.    Lymphatics: No cervical, supraclavicular, or axillary nodes.  Prominent right axillary fat pad. Resp: End inspiratory rhonchi at the lower posterior chest bilaterally, no respiratory distress Cardio: Regular rate and rhythm GI: No hepatosplenomegaly Vascular: No leg edema  Lab Results:  Lab Results  Component Value Date   WBC 5.3 01/10/2022   HGB 14.1 01/10/2022   HCT 43.4 01/10/2022   MCV 92.3 01/10/2022   PLT 229 01/10/2022   NEUTROABS 5.0 12/05/2021    CMP  Lab Results  Component Value Date   NA 138 01/10/2022   K 4.2 01/10/2022   CL 104 01/10/2022   CO2 26 01/10/2022   GLUCOSE 100 (H) 01/10/2022   BUN 16 01/10/2022   CREATININE 1.08 (H) 01/10/2022   CALCIUM 9.6 01/10/2022   PROT 6.6 12/05/2021   ALBUMIN 4.3 12/05/2021   AST 41 12/05/2021   ALT 52 (H) 12/05/2021   ALKPHOS 59 12/05/2021   BILITOT 0.5 12/05/2021   GFRNONAA 55 (L) 01/10/2022    Medications: I have reviewed the patient's current medications.   Assessment/Plan:  Right lower extremity deep vein thrombosis and bilateral pulmonary embolism CT chest 09/02/2021-bilateral pulmonary embolism with right heart strain Right peroneal DVT 09/03/2021 Admission 09/02/2021-heparin, transition to apixaban 09/04/2021 Lower extremity Dopplers 01/02/2022-negative for DVT CT chest 01/10/2022-negative for pulmonary embolism Negative hypercoagulation panel  10/03/2021 Normal D-dimer 03/06/2022 Apixaban changed to 2.5 mg twice daily beginning August 2023 History of cough and exertional dyspnea secondary to #1 Hypertension   Disposition: Ms. Samantha Steele was diagnosed with a DVT and pulmonary embolism in January.  She has been maintained on apixaban anticoagulation without evidence of recurrent venous thrombosis.  The DVT/PE appears unprovoked, though she may have had COVID-19 in late 2022.  She has no apparent risk factor for venous thromboembolism.  A hypercoagulation panel was negative earlier this year.  She has been off of apixaban for the past 2 days.  We will check a lupus anticoagulant panel today.  We discussed the risk of continuing apixaban anticoagulation versus discontinuing anticoagulation.  We discussed the small risk of recurrent DVT and PE.  We also reviewed the bleeding risk associated with long-term anticoagulation therapy.  She is most comfortable continuing anticoagulation.  She will continue apixaban.  I discussed data supporting the use of reduced intensity anticoagulation.  She will begin apixaban at a dose of 2.5 mg daily after she finishes the current supply of the 5 mg dose.  Ms. Samantha Steele will return for an office visit in 6 months.  She reports being up-to-date on mammography and colon cancer screening.  Thornton Papas, MD  03/10/2022  11:18 AM

## 2022-03-10 NOTE — Addendum Note (Signed)
Addended by: Alessandra Bevels E on: 03/10/2022 12:26 PM   Modules accepted: Orders

## 2022-03-12 LAB — LUPUS ANTICOAGULANT PANEL
DRVVT: 37.2 s (ref 0.0–47.0)
PTT Lupus Anticoagulant: 32 s (ref 0.0–43.5)

## 2022-03-13 ENCOUNTER — Telehealth: Payer: Self-pay

## 2022-03-13 NOTE — Telephone Encounter (Signed)
Pt verbalized understanding. Pt also confirmed dose reduction and will call for refill of Eliquis when she runs out

## 2022-03-13 NOTE — Telephone Encounter (Signed)
-----   Message from Ladene Artist, MD sent at 03/12/2022  5:28 PM EDT ----- Please call patient, lupus anticoagulant screen was negative, countinue apixaban, f/u as scheduled

## 2022-03-22 ENCOUNTER — Telehealth: Payer: Self-pay

## 2022-03-24 NOTE — Telephone Encounter (Signed)
See 03/07/2022 note

## 2022-03-27 ENCOUNTER — Encounter: Payer: Self-pay | Admitting: Oncology

## 2022-04-19 ENCOUNTER — Ambulatory Visit: Payer: Medicare PPO | Admitting: Cardiovascular Disease

## 2022-04-20 ENCOUNTER — Other Ambulatory Visit: Payer: Self-pay | Admitting: *Deleted

## 2022-04-20 MED ORDER — APIXABAN 2.5 MG PO TABS
2.5000 mg | ORAL_TABLET | Freq: Every day | ORAL | 0 refills | Status: DC
Start: 2022-04-20 — End: 2022-06-02

## 2022-04-20 NOTE — Telephone Encounter (Signed)
Patient reports she has completed her 5 mg apixaban and is ready for refill of the 2.5 mg. Confirmed w/MD note she is being weaned to 2.5 mg daily.

## 2022-05-01 DIAGNOSIS — Z23 Encounter for immunization: Secondary | ICD-10-CM | POA: Diagnosis not present

## 2022-05-10 DIAGNOSIS — I779 Disorder of arteries and arterioles, unspecified: Secondary | ICD-10-CM | POA: Diagnosis not present

## 2022-05-10 DIAGNOSIS — Z0001 Encounter for general adult medical examination with abnormal findings: Secondary | ICD-10-CM | POA: Diagnosis not present

## 2022-05-10 DIAGNOSIS — I10 Essential (primary) hypertension: Secondary | ICD-10-CM | POA: Diagnosis not present

## 2022-05-10 DIAGNOSIS — E7800 Pure hypercholesterolemia, unspecified: Secondary | ICD-10-CM | POA: Diagnosis not present

## 2022-05-10 DIAGNOSIS — Z79899 Other long term (current) drug therapy: Secondary | ICD-10-CM | POA: Diagnosis not present

## 2022-05-10 DIAGNOSIS — D6859 Other primary thrombophilia: Secondary | ICD-10-CM | POA: Diagnosis not present

## 2022-05-10 DIAGNOSIS — N1831 Chronic kidney disease, stage 3a: Secondary | ICD-10-CM | POA: Diagnosis not present

## 2022-05-12 DIAGNOSIS — Z1211 Encounter for screening for malignant neoplasm of colon: Secondary | ICD-10-CM | POA: Diagnosis not present

## 2022-05-17 DIAGNOSIS — H524 Presbyopia: Secondary | ICD-10-CM | POA: Diagnosis not present

## 2022-06-02 ENCOUNTER — Other Ambulatory Visit: Payer: Self-pay | Admitting: Oncology

## 2022-06-03 ENCOUNTER — Encounter: Payer: Self-pay | Admitting: Oncology

## 2022-06-05 ENCOUNTER — Other Ambulatory Visit: Payer: Self-pay

## 2022-06-05 DIAGNOSIS — I2699 Other pulmonary embolism without acute cor pulmonale: Secondary | ICD-10-CM

## 2022-06-05 MED ORDER — APIXABAN 2.5 MG PO TABS: 2.5000 mg | ORAL_TABLET | Freq: Two times a day (BID) | ORAL | 0 refills | Status: DC

## 2022-06-12 DIAGNOSIS — D1801 Hemangioma of skin and subcutaneous tissue: Secondary | ICD-10-CM | POA: Diagnosis not present

## 2022-06-12 DIAGNOSIS — L821 Other seborrheic keratosis: Secondary | ICD-10-CM | POA: Diagnosis not present

## 2022-06-12 DIAGNOSIS — L820 Inflamed seborrheic keratosis: Secondary | ICD-10-CM | POA: Diagnosis not present

## 2022-06-12 DIAGNOSIS — L661 Lichen planopilaris: Secondary | ICD-10-CM | POA: Diagnosis not present

## 2022-06-12 DIAGNOSIS — L918 Other hypertrophic disorders of the skin: Secondary | ICD-10-CM | POA: Diagnosis not present

## 2022-07-14 DIAGNOSIS — M79604 Pain in right leg: Secondary | ICD-10-CM | POA: Diagnosis not present

## 2022-07-19 ENCOUNTER — Other Ambulatory Visit: Payer: Self-pay | Admitting: Oncology

## 2022-07-19 ENCOUNTER — Other Ambulatory Visit: Payer: Self-pay

## 2022-07-19 ENCOUNTER — Encounter: Payer: Self-pay | Admitting: Physical Therapy

## 2022-07-19 ENCOUNTER — Ambulatory Visit: Payer: Medicare PPO | Attending: Family Medicine | Admitting: Physical Therapy

## 2022-07-19 DIAGNOSIS — M5431 Sciatica, right side: Secondary | ICD-10-CM | POA: Diagnosis not present

## 2022-07-19 DIAGNOSIS — M6281 Muscle weakness (generalized): Secondary | ICD-10-CM | POA: Insufficient documentation

## 2022-07-19 DIAGNOSIS — M79604 Pain in right leg: Secondary | ICD-10-CM | POA: Insufficient documentation

## 2022-07-19 DIAGNOSIS — I2699 Other pulmonary embolism without acute cor pulmonale: Secondary | ICD-10-CM

## 2022-07-19 NOTE — Therapy (Signed)
OUTPATIENT PHYSICAL THERAPY THORACOLUMBAR EVALUATION   Patient Name: Samantha Steele MRN: [id number] DOB:1950/11/10, 71 y.o., female Today's Date: 07/19/2022  END OF SESSION:  PT End of Session - 07/19/22 1151     Visit Number 1    Date for PT Re-Evaluation 09/13/22    Authorization Type [employer] will submit    PT Start Time 1146    PT Stop Time 1230    PT Time Calculation (min) 44 min    Activity Tolerance Patient tolerated treatment well             Past Medical History:  Diagnosis Date   Hypertension    Osteopenia    Past Surgical History:  Procedure Laterality Date   TRIGGER FINGER RELEASE  2015   TRIGGER FINGER RELEASE Left 11/16/2015   Procedure: RELEASE TRIGGER FINGER/A-1 PULLEY LEFT MIDDLE FINGER;  Surgeon: Daryll Brod, MD;  Location: [facility];  Service: Orthopedics;  Laterality: Left;  middle finger   TRIGGER FINGER RELEASE Right 04/03/2017   Procedure: RELEASE TRIGGER FINGER/A-1 PULLEY RIGHT RING;  Surgeon: Daryll Brod, MD;  Location: [place];  Service: Orthopedics;  Laterality: Right;   Patient Active Problem List   Diagnosis Date Noted   Atypical chest pain 12/21/2021   DVT (deep venous thrombosis) ([place]) 12/21/2021   Shortness of breath 12/21/2021   Submassive acute pulmonary embolism with acute DVT involving the right peroneal vein 09/02/2021   Essential hypertension 09/02/2021   Hyperlipidemia 09/02/2021   Chronic kidney disease 09/02/2021   Carotid artery disease ([place]) 09/02/2021    PCP: Lujean Amel MD  REFERRING PROVIDER: Lujean Amel MD  REFERRING DIAG: sciatic L3-L4 right leg pain M79.604  Rationale for Evaluation and Treatment: Rehabilitation  THERAPY DIAG:  right sciatica; weakness  ONSET DATE: August  SUBJECTIVE:                                                                                                                                                                                            SUBJECTIVE STATEMENT: Pain started right after RSV shot causing multi joint aching.  Then got flu shot and affected just hips and knees.  Right leg got worse after putting fitted sheets on.  Pain to right anterior thigh and anterior knee and a few times to shin.  No longer has back pain.  I do isometrics for my back.  I haven't gotten my strength back from multiple pulmonary emboli  PERTINENT HISTORY:  HTN;  hx of DVT right leg with multiple pulmonary emboli Jan 2023; osteopenia left hip on edge of osteoporosis Reports hypermobility when younger Max walk 6 blocks but mostly  around the block Husband had a stroke this year Shoulder pain with planks PAIN:  PAIN:  Are you having pain? no NPRS scale: 0/10 Pain location: right anterior thigh mostly to knee    Aggravating factors: walking; lifting to change fitted sheet; initially sitting would bother especially rising Relieving factors: prednisone has helped finishes tomorrow  PRECAUTIONS: None  WEIGHT BEARING RESTRICTIONS: No  FALLS:  Has patient fallen in last 6 months? No  LIVING ENVIRONMENT: Lives with: lives with their spouse Lives in: House/apartment Stairs: Yes: Internal: 10 steps; can reach both Has following equipment at home: None  OCCUPATION: retired  PLOF: Systems analyst, quilting, writing, puzzles, reading; gardening   PATIENT GOALS: determine if current HEP are appropriate, strengthen hips, prevent impingement  NEXT MD VISIT:   OBJECTIVE:   DIAGNOSTIC FINDINGS:  If PT doesn't work will do MRI  PATIENT SURVEYS:  FOTO 58%   COGNITION: Overall cognitive status: Within functional limits for tasks assessed     MUSCLE LENGTH: Hamstrings: Right 90 deg; Left 90 deg Thomas test: Right 15 deg; Left 15 deg  POSTURE: decreased lumbar lordosis  PALPATION: Mild tender points in piriformis  LUMBAR ROM:   AROM eval  Flexion Fingertips to floor no pain  Extension 20  Right lateral flexion 30  Left  lateral flexion 30  Right rotation   Left rotation    (Blank rows = not tested)  LOWER EXTREMITY ROM:   full passive hip and knee joint mobility  LOWER EXTREMITY MMT:  SLS left > 10 sec; right < 3 sec with obvious pelvic drop With sidelying hip abduction left 4+/5, right with severe pain with attempted hip abduction (no resistance) 2/5 Pain with standing hip abduction on right   TRUNK STRENGTH:  Decreased activation of transverse abdominus muscles; abdominals 4-/5; decreased activation of lumbar multifidi; trunk extensors 4-/5;  quadruped bird dog pelvic drop with WB on right  LUMBAR SPECIAL TESTS:  Straight leg raise test: Negative, Slump test: Negative, and Single leg stance test: Positive No pain with prone knee bend test on right or left  FUNCTIONAL TESTS:  5 times sit to stand: 9.65 no hands Timed up and go (TUG): 7.85 no hands  GAIT:  Comments: with faster gait patient notes increased buttock/hip pain, decreased stance time on right noted  TODAY'S TREATMENT:                                                                                                                              DATE: 07/19/22    PATIENT EDUCATION:  Education details: Educated patient on anatomy and physiology of current symptoms, prognosis, plan of care as well as initial self care strategies to promote recovery including anti-inflammatory strategies: meditation, exercise, diet and sleep  Person educated: Patient Education method: Explanation Education comprehension: verbalized understanding  HOME EXERCISE PROGRAM: Discussed doing unaffected side hip abduction in standing (less painful)  ASSESSMENT:  CLINICAL IMPRESSION: Patient is a 71 y.o. female  who was seen today for physical therapy evaluation and treatment for right sciatica.  The patient would benefit from PT to address  strength asymmetries in lumbo/pelvic and hip regions and pain levels that are currently affecting activities of daily living  at home  including rising from sitting,  walking, lifting and performing hobbies and recreational activities.     OBJECTIVE IMPAIRMENTS: decreased activity tolerance, decreased mobility, difficulty walking, decreased strength, increased fascial restrictions, impaired perceived functional ability, and pain.   ACTIVITY LIMITATIONS: lifting, transfers, and locomotion level  PARTICIPATION LIMITATIONS: community activity and yard work  PERSONAL FACTORS: 1-2 comorbidities: pulmonary emboli affecting conditioning and time since onset  are also affecting patient's functional outcome.   REHAB POTENTIAL: Good  CLINICAL DECISION MAKING: Stable/uncomplicated  EVALUATION COMPLEXITY: Low   GOALS: Goals reviewed with patient? Yes  SHORT TERM GOALS: Target date: 08/16/2022    The patient will demonstrate knowledge of basic self care strategies and exercises to promote healing   Baseline: Goal status: INITIAL  2.  The patient will report a 40% improvement in pain levels with functional activities which are currently difficult including walking, sit to stand, lateral movements Baseline:  Goal status: INITIAL  3.  Improved right hip abduction strength to at least 3/5 needed for walking longer distances Baseline:  Goal status: INITIAL  4.  Able to walk 6 min with pain level 5/10 Baseline:  Goal status: INITIAL   LONG TERM GOALS: Target date: 09/13/2022   The patient will be independent in a safe self progression of a home exercise program to promote further recovery of function   Baseline:  Goal status: INITIAL  2.  The patient will report a 75% improvement in pain levels with functional activities which are currently difficult including walking, sit to stand and lateral movements Baseline:  Goal status: INITIAL  3.  Right hip abduction strength grossly 3+/5 needed for longer distance walking Baseline:  Goal status: INITIAL  4.  6 MWT goal to be set  Baseline:  Goal status:  INITIAL  5.  The patient will have improved FOTO score to   69%    indicating improved function with less pain  Baseline:  Goal status: INITIAL  PLAN:  PT FREQUENCY: 2x/week  PT DURATION: 8 weeks  PLANNED INTERVENTIONS: Therapeutic exercises, Therapeutic activity, Neuromuscular re-education, Balance training, Gait training, Patient/Family education, Self Care, Joint mobilization, Aquatic Therapy, Dry Needling, Electrical stimulation, Spinal manipulation, Spinal mobilization, Cryotherapy, Moist heat, Traction, Ultrasound, Ionotophoresis 4mg /ml Dexamethasone, Manual therapy, and Re-evaluation.  PLAN FOR NEXT SESSION: check 6 min walk test for baseline; high symptom irritability with right hip abduction; try left hip abduction in standing, standing small arc hip extension; supine clams  , PT 07/19/22 6:49 PM Phone: [phone] Fax: [phone]

## 2022-07-21 ENCOUNTER — Ambulatory Visit: Payer: Medicare PPO | Admitting: Physical Therapy

## 2022-07-21 DIAGNOSIS — M6281 Muscle weakness (generalized): Secondary | ICD-10-CM | POA: Diagnosis not present

## 2022-07-21 DIAGNOSIS — M79604 Pain in right leg: Secondary | ICD-10-CM | POA: Diagnosis not present

## 2022-07-21 DIAGNOSIS — M5431 Sciatica, right side: Secondary | ICD-10-CM

## 2022-07-21 NOTE — Therapy (Signed)
OUTPATIENT PHYSICAL THERAPY THORACOLUMBAR EVALUATION   Patient Name: Samantha Steele MRN: [id number] DOB:11/09/1950, 71 y.o., female Today's Date: 07/21/2022  END OF SESSION:  PT End of Session - 07/21/22 0844     Visit Number 2    Number of Visits 16    Date for PT Re-Evaluation 09/13/22    Authorization Type [employer] 16 visits  12/6-1/31/24    PT Start Time 0845    PT Stop Time 0925    PT Time Calculation (min) 40 min             Past Medical History:  Diagnosis Date   Hypertension    Osteopenia    Past Surgical History:  Procedure Laterality Date   TRIGGER FINGER RELEASE  2015   TRIGGER FINGER RELEASE Left 11/16/2015   Procedure: RELEASE TRIGGER FINGER/A-1 PULLEY LEFT MIDDLE FINGER;  Surgeon: Daryll Brod, MD;  Location: [facility];  Service: Orthopedics;  Laterality: Left;  middle finger   TRIGGER FINGER RELEASE Right 04/03/2017   Procedure: RELEASE TRIGGER FINGER/A-1 PULLEY RIGHT RING;  Surgeon: Daryll Brod, MD;  Location: [facility];  Service: Orthopedics;  Laterality: Right;   Patient Active Problem List   Diagnosis Date Noted   Atypical chest pain 12/21/2021   DVT (deep venous thrombosis) ([place]) 12/21/2021   Shortness of breath 12/21/2021   Submassive acute pulmonary embolism with acute DVT involving the right peroneal vein 09/02/2021   Essential hypertension 09/02/2021   Hyperlipidemia 09/02/2021   Chronic kidney disease 09/02/2021   Carotid artery disease ([place]) 09/02/2021    PCP: Lujean Amel MD  REFERRING PROVIDER: Lujean Amel MD  REFERRING DIAG: sciatic L3-L4 right leg pain M79.604  Rationale for Evaluation and Treatment: Rehabilitation  THERAPY DIAG:  right sciatica; weakness  ONSET DATE: August  SUBJECTIVE:                                                                                                                                                                                           SUBJECTIVE STATEMENT: I  was glad to get this appt.  I slept well the last 2 night.  Walked yesterday and had to lie down afterwards.  (Fatigued more than pain).     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 no NPRS scale: 0/10 Pain location: right anterior thigh mostly to knee  Aggravating factors: walking; lifting to change fitted sheet; initially sitting would bother especially rising Relieving factors: prednisone has helped finishes tomorrow  PRECAUTIONS: None  WEIGHT BEARING RESTRICTIONS: No  FALLS:  Has patient fallen in last 6 months? No  LIVING ENVIRONMENT: Lives with: lives with their spouse Lives in: House/apartment Stairs: Yes: Internal: 10 steps; can reach both Has following equipment at home: None  OCCUPATION: retired  PLOF: Arboriculturist, [place], writing, puzzles, reading; gardening   PATIENT GOALS: determine if current HEP are appropriate, strengthen hips, prevent impingement  NEXT MD VISIT:   OBJECTIVE:   DIAGNOSTIC FINDINGS:  If PT doesn't work will do MRI  PATIENT SURVEYS:  FOTO 58%   COGNITION: Overall cognitive status: Within functional limits for tasks assessed     MUSCLE LENGTH: Hamstrings: Right 90 deg; Left 90 deg Thomas test: Right 15 deg; Left 15 deg  POSTURE: decreased lumbar lordosis  PALPATION: Mild tender points in piriformis  LUMBAR ROM:   AROM eval  Flexion Fingertips to floor no pain  Extension 20  Right lateral flexion 30  Left lateral flexion 30  Right rotation   Left rotation    (Blank rows = not tested)  LOWER EXTREMITY ROM:   full passive hip and knee joint mobility  LOWER EXTREMITY MMT:  SLS left > 10 sec; right < 3 sec with obvious pelvic drop With  sidelying hip abduction left 4+/5, right with severe pain with attempted hip abduction (no resistance) 2/5 Pain with standing hip abduction on right   TRUNK STRENGTH:  Decreased activation of transverse abdominus muscles; abdominals 4-/5; decreased activation of lumbar multifidi; trunk extensors 4-/5;  quadruped bird dog pelvic drop with WB on right  LUMBAR SPECIAL TESTS:  Straight leg raise test: Negative, Slump test: Negative, and Single leg stance test: Positive No pain with prone knee bend test on right or left  FUNCTIONAL TESTS:  5 times sit to stand: 9.65 no hands Timed up and go (TUG): 7.85 no hands  GAIT:  Comments: with faster gait patient notes increased buttock/hip pain, decreased stance time on right noted  TODAY'S TREATMENT:                                                                                                                              DATE: 07/21/22  Supine abdominal draw in 5x Hooklying Isometric Hip Flexion  5x2 - Supine Bent Leg Lift and lower 5x2  - Hooklying Clamshell with Resistance  10 reps - Clam with Resistance   10 reps - Beginner Front Arm Support   5-10 reps - Quadruped on Forearms Hip Extension  5-10 reps - Supine Bridge 10 reps Attempted wall hip abduction isometric with pillow but too painful  - Standing Hip Abduction with Counter Support 10 reps Neuromuscular re-education: muscle activation with verbal and tactile cues to improve activation of gluteals and transverse abdominus muscles  PATIENT EDUCATION:  Education details: Educated patient on anatomy and physiology of  current symptoms, prognosis, plan of care as well as initial self care strategies to promote recovery including anti-inflammatory strategies: meditation, exercise, diet and sleep  Person educated: Patient Education method: Explanation Education comprehension: verbalized understanding  HOME EXERCISE PROGRAM:  Access Code: [email or web address] URL:  [email or web address] Date: 07/21/2022 Prepared by: Ruben Im  Exercises - Hooklying Isometric Hip Flexion  - 1 x daily - 7 x weekly - 1 sets - 10 reps - Supine Bent Leg Lift with Knee Extension  - 1 x daily - 7 x weekly - 1 sets - 10 reps - Hooklying Clamshell with Resistance  - 1 x daily - 7 x weekly - 1 sets - 10 reps - Clam with Resistance  - 1 x daily - 7 x weekly - 1 sets - 10 reps - Beginner Front Arm Support  - 1 x daily - 7 x weekly - 1 sets - 5-10 reps - Quadruped on Forearms Hip Extension  - 1 x daily - 7 x weekly - 1 sets - 5-10 reps - Supine Bridge  - 1 x daily - 7 x weekly - 1 sets - 10 reps - Standing Hip Abduction with Counter Support  - 1 x daily - 7 x weekly - 1 sets - 10 reps  ASSESSMENT:  CLINICAL IMPRESSION: Verbal cues for proper recruitment of glute medius muscle activation needed for standing and walking longer periods of time without a pelvic drop.   Patient lacks strength to SLS on right and also painful.  Modified exercise selection based on pain and fatigue level.      OBJECTIVE IMPAIRMENTS: decreased activity tolerance, decreased mobility, difficulty walking, decreased strength, increased fascial restrictions, impaired perceived functional ability, and pain.   ACTIVITY LIMITATIONS: lifting, transfers, and locomotion level  PARTICIPATION LIMITATIONS: community activity and yard work  PERSONAL FACTORS: 1-2 comorbidities: pulmonary emboli affecting conditioning and time since onset  are also affecting patient's functional outcome.   REHAB POTENTIAL: Good  CLINICAL DECISION MAKING: Stable/uncomplicated  EVALUATION COMPLEXITY: Low   GOALS: Goals reviewed with patient? Yes  SHORT TERM GOALS: Target date: 08/16/2022    The patient will demonstrate knowledge of basic self care strategies and exercises to promote healing   Baseline: Goal status: INITIAL  2.  The patient will report a 40% improvement in pain levels with functional  activities which are currently difficult including walking, sit to stand, lateral movements Baseline:  Goal status: INITIAL  3.  Improved right hip abduction strength to at least 3/5 needed for walking longer distances Baseline:  Goal status: INITIAL  4.  Able to walk 6 min with pain level 5/10 Baseline:  Goal status: INITIAL   LONG TERM GOALS: Target date: 09/13/2022   The patient will be independent in a safe self progression of a home exercise program to promote further recovery of function   Baseline:  Goal status: INITIAL  2.  The patient will report a 75% improvement in pain levels with functional activities which are currently difficult including walking, sit to stand and lateral movements Baseline:  Goal status: INITIAL  3.  Right hip abduction strength grossly 3+/5 needed for longer distance walking Baseline:  Goal status: INITIAL  4. Able to ambulate 15 min with hip pain level 3/10   Baseline:  Goal status: INITIAL  5.  The patient will have improved FOTO score to   69%    indicating improved function with less pain  Baseline:  Goal status: INITIAL  PLAN:  PT FREQUENCY: 2x/week  PT  DURATION: 8 weeks  PLANNED INTERVENTIONS: Therapeutic exercises, Therapeutic activity, Neuromuscular re-education, Balance training, Gait training, Patient/Family education, Self Care, Joint mobilization, Aquatic Therapy, Dry Needling, Electrical stimulation, Spinal manipulation, Spinal mobilization, Cryotherapy, Moist heat, Traction, Ultrasound, Ionotophoresis 21m/ml Dexamethasone, Manual therapy, and Re-evaluation.  PLAN FOR NEXT SESSION: add sit to stands to HEP; try "stand tall" exercise;  moderate symptom irritability with right hip abduction;  lumbo/pelvic/hip strengthening  SRuben Im PT 07/21/22 12:04 PM Phone: [phone]: [phone]

## 2022-08-09 ENCOUNTER — Ambulatory Visit: Payer: Medicare PPO

## 2022-08-09 DIAGNOSIS — M6281 Muscle weakness (generalized): Secondary | ICD-10-CM | POA: Diagnosis not present

## 2022-08-09 DIAGNOSIS — M5431 Sciatica, right side: Secondary | ICD-10-CM

## 2022-08-09 DIAGNOSIS — R262 Difficulty in walking, not elsewhere classified: Secondary | ICD-10-CM

## 2022-08-09 DIAGNOSIS — M79604 Pain in right leg: Secondary | ICD-10-CM | POA: Diagnosis not present

## 2022-08-09 DIAGNOSIS — R252 Cramp and spasm: Secondary | ICD-10-CM

## 2022-08-09 NOTE — Therapy (Signed)
OUTPATIENT PHYSICAL THERAPY THORACOLUMBAR EVALUATION   Patient Name: Samantha Steele MRN: [id number] DOB:[birth year], 71 y.o., female Today's Date: 08/09/2022  END OF SESSION:  PT End of Session - 08/09/22 1406     Visit Number 3    Number of Visits 16    Date for PT Re-Evaluation 09/13/22    Authorization Type [employer] 16 visits  12/6-1/31/24    PT Start Time 1406    PT Stop Time 1445    PT Time Calculation (min) 39 min    Activity Tolerance Patient tolerated treatment well    Behavior During Therapy WFL for tasks assessed/performed             Past Medical History:  Diagnosis Date   Hypertension    Osteopenia    Past Surgical History:  Procedure Laterality Date   TRIGGER FINGER RELEASE  2015   TRIGGER FINGER RELEASE Left 11/16/2015   Procedure: RELEASE TRIGGER FINGER/A-1 PULLEY LEFT MIDDLE FINGER;  Surgeon: Daryll Brod, MD;  Location: [facility];  Service: Orthopedics;  Laterality: Left;  middle finger   TRIGGER FINGER RELEASE Right 04/03/2017   Procedure: RELEASE TRIGGER FINGER/A-1 PULLEY RIGHT RING;  Surgeon: Daryll Brod, MD;  Location: [facility];  Service: Orthopedics;  Laterality: Right;   Patient Active Problem List   Diagnosis Date Noted   Atypical chest pain 12/21/2021   DVT (deep venous thrombosis) ([facility]) 12/21/2021   Shortness of breath 12/21/2021   Submassive acute pulmonary embolism with acute DVT involving the right peroneal vein 09/02/2021   Essential hypertension 09/02/2021   Hyperlipidemia 09/02/2021   Chronic kidney disease 09/02/2021   Carotid artery disease ([place]) 09/02/2021    PCP: Lujean Amel MD  REFERRING PROVIDER: Lujean Amel MD  REFERRING DIAG: sciatic L3-L4 right leg pain M79.604  Rationale for Evaluation and Treatment: Rehabilitation  THERAPY DIAG:  right sciatica; weakness  ONSET DATE: August  SUBJECTIVE:                                                                                                                                                                                            SUBJECTIVE STATEMENT: "I'm doing pretty good.  My walking is getting better.  I'm going about 6 blocks now. No pain at the moment"    PERTINENT HISTORY:  HTN;  hx of DVT right leg with multiple pulmonary emboli Jan 2023; osteopenia left hip on edge of osteoporosis Reports hypermobility when younger Max walk 6 blocks but mostly around the block Husband had a stroke this year Shoulder pain with planks Tai Chi PAIN:  PAIN:  Are you having pain? no NPRS scale: 0/10  Pain location: right anterior thigh mostly to knee    Aggravating factors: walking; lifting to change fitted sheet; initially sitting would bother especially rising Relieving factors: prednisone has helped finishes tomorrow  PRECAUTIONS: None  WEIGHT BEARING RESTRICTIONS: No  FALLS:  Has patient fallen in last 6 months? No  LIVING ENVIRONMENT: Lives with: lives with their spouse Lives in: House/apartment Stairs: Yes: Internal: 10 steps; can reach both Has following equipment at home: None  OCCUPATION: retired  PLOF: Arboriculturist, [place], writing, puzzles, reading; gardening   PATIENT GOALS: determine if current HEP are appropriate, strengthen hips, prevent impingement  NEXT MD VISIT:   OBJECTIVE:   DIAGNOSTIC FINDINGS:  If PT doesn't work will do MRI  PATIENT SURVEYS:  FOTO 58%   COGNITION: Overall cognitive status: Within functional limits for tasks assessed     MUSCLE LENGTH: Hamstrings: Right 90 deg; Left 90 deg Thomas test: Right 15 deg; Left 15 deg  POSTURE: decreased lumbar lordosis  PALPATION: Mild tender points in piriformis  LUMBAR ROM:   AROM eval  Flexion Fingertips to floor no pain  Extension 20  Right lateral flexion 30  Left lateral flexion 30  Right rotation   Left rotation    (Blank rows = not tested)  LOWER EXTREMITY ROM:   full passive hip and knee joint  mobility  LOWER EXTREMITY MMT:  SLS left > 10 sec; right < 3 sec with obvious pelvic drop With sidelying hip abduction left 4+/5, right with severe pain with attempted hip abduction (no resistance) 2/5 Pain with standing hip abduction on right   TRUNK STRENGTH:  Decreased activation of transverse abdominus muscles; abdominals 4-/5; decreased activation of lumbar multifidi; trunk extensors 4-/5;  quadruped bird dog pelvic drop with WB on right  LUMBAR SPECIAL TESTS:  Straight leg raise test: Negative, Slump test: Negative, and Single leg stance test: Positive No pain with prone knee bend test on right or left  FUNCTIONAL TESTS:  5 times sit to stand: 9.65 no hands Timed up and go (TUG): 7.85 no hands  GAIT:  Comments: with faster gait patient notes increased buttock/hip pain, decreased stance time on right noted  TODAY'S TREATMENT:                                                                                                                              DATE: 08/09/22  Nustep x 5 min level 3  Seated PPT x 10 Seated PPT with march x 20 Hooklying PPT x 10 Supine PPT with 90/90 heel tap x 20 PPT with dying bug Lower trunk rotation 10 each side  Hooklying Clamshell with Resistance  20 reps (patient c/o inner hip pain similar    DATE: 07/21/22  Supine abdominal draw in 5x Hooklying Isometric Hip Flexion  5x2 - Supine Bent Leg Lift and lower 5x2  - Hooklying Clamshell with Resistance  10 reps - Clam with Resistance   10 reps - Beginner Front Arm  Support   5-10 reps - Quadruped on Forearms Hip Extension  5-10 reps - Supine Bridge 10 reps Attempted wall hip abduction isometric with pillow but too painful  - Standing Hip Abduction with Counter Support 10 reps Neuromuscular re-education: muscle activation with verbal and tactile cues to improve activation of gluteals and transverse abdominus muscles  PATIENT EDUCATION:  Education details: Educated patient on anatomy and physiology  of current symptoms, prognosis, plan of care as well as initial self care strategies to promote recovery including anti-inflammatory strategies: meditation, exercise, diet and sleep  Person educated: Patient Education method: Explanation Education comprehension: verbalized understanding  HOME EXERCISE PROGRAM:  Access Code: [email or web address] URL: [email or web address] Date: 08/09/2022 Prepared by: Candyce Churn  Exercises - Hooklying Isometric Hip Flexion  - 1 x daily - 7 x weekly - 1 sets - 10 reps - Supine Bent Leg Lift with Knee Extension  - 1 x daily - 7 x weekly - 1 sets - 10 reps - Hooklying Clamshell with Resistance  - 1 x daily - 7 x weekly - 1 sets - 10 reps - Clam with Resistance  - 1 x daily - 7 x weekly - 1 sets - 10 reps - Supine Bridge  - 1 x daily - 7 x weekly - 1 sets - 10 reps - Standing Hip Abduction with Counter Support  - 1 x daily - 7 x weekly - 1 sets - 10 reps - Standing Hamstring Stretch on Chair  - 1 x daily - 7 x weekly - 1 sets - 3 reps - 30 sec hold - Standing Quad Stretch with Table and Chair Support  - 1 x daily - 7 x weekly - 1 sets - 3 reps - 30 sec hold - Butterfly Groin Stretch  - 1 x daily - 7 x weekly - 1 sets - 10 reps - 10 sec hold - Supine Posterior Pelvic Tilt  - 1 x daily - 7 x weekly - 1 sets - 10 reps - Supine 90/90 Alternating Heel Touches with Posterior Pelvic Tilt  - 1 x daily - 7 x weekly - 2 sets - 10 reps - Supine Dead Bug with Leg Extension  - 1 x daily - 7 x weekly - 2 sets - 10 reps  ASSESSMENT:  CLINICAL IMPRESSION: Patient was able to complete all tasks only having discomfort with sidelying clam in the anterior hip area.  We added standing hip flexor and quad stretches.  She had explained that she likes to work in her garden but she sits in an Asian squat position to work.  PT had her demonstrate this.  She appears to be in deep squat position but also leaning fwd to work on flowers deeper into her flower bed.  We discussed how  this could contribute to her lumbar spine issues and resulting in the leg pain.  We offered an optional position and suggested that she focus on maintaining neutral spine and avoid rounded back position.  She would benefit from continued skilled PT to progress toward all goals and be able to do routine functional activities without pain.     OBJECTIVE IMPAIRMENTS: decreased activity tolerance, decreased mobility, difficulty walking, decreased strength, increased fascial restrictions, impaired perceived functional ability, and pain.   ACTIVITY LIMITATIONS: lifting, transfers, and locomotion level  PARTICIPATION LIMITATIONS: community activity and yard work  PERSONAL FACTORS: 1-2 comorbidities: pulmonary emboli affecting conditioning and time since onset  are also affecting patient's functional outcome.   REHAB POTENTIAL: Good  CLINICAL DECISION MAKING: Stable/uncomplicated  EVALUATION COMPLEXITY: Low   GOALS: Goals reviewed with patient? Yes  SHORT TERM GOALS: Target date: 08/16/2022    The patient will demonstrate knowledge of basic self care strategies and exercises to promote healing   Baseline: Goal status: INITIAL  2.  The patient will report a 40% improvement in pain levels with functional activities which are currently difficult including walking, sit to stand, lateral movements Baseline:  Goal status: INITIAL  3.  Improved right hip abduction strength to at least 3/5 needed for walking longer distances Baseline:  Goal status: INITIAL  4.  Able to walk 6 min with pain level 5/10 Baseline:  Goal status: INITIAL   LONG TERM GOALS: Target date: 09/13/2022   The patient will be independent in a safe self progression of a home exercise program to promote further recovery of function   Baseline:  Goal status: INITIAL  2.  The patient will report a 75% improvement in pain levels with functional activities which are currently difficult including walking, sit to stand and  lateral movements Baseline:  Goal status: INITIAL  3.  Right hip abduction strength grossly 3+/5 needed for longer distance walking Baseline:  Goal status: INITIAL  4. Able to ambulate 15 min with hip pain level 3/10   Baseline:  Goal status: INITIAL  5.  The patient will have improved FOTO score to   69%    indicating improved function with less pain  Baseline:  Goal status: INITIAL  PLAN:  PT FREQUENCY: 2x/week  PT DURATION: 8 weeks  PLANNED INTERVENTIONS: Therapeutic exercises, Therapeutic activity, Neuromuscular re-education, Balance training, Gait training, Patient/Family education, Self Care, Joint mobilization, Aquatic Therapy, Dry Needling, Electrical stimulation, Spinal manipulation, Spinal mobilization, Cryotherapy, Moist heat, Traction, Ultrasound, Ionotophoresis 29m/ml Dexamethasone, Manual therapy, and Re-evaluation.  PLAN FOR NEXT SESSION: add sit to stands to HEP; try "stand tall" exercise;  moderate symptom irritability with right hip abduction;  lumbo/pelvic/hip strengthening  Nirvan Laban B. Breann Losano, PT 08/09/22 8:07 PM  [id number] [place]aceG[place]wood [place] [address]Phone # [phone] [phone]

## 2022-08-15 ENCOUNTER — Ambulatory Visit: Payer: Medicare PPO | Attending: Family Medicine | Admitting: Physical Therapy

## 2022-08-15 DIAGNOSIS — M5431 Sciatica, right side: Secondary | ICD-10-CM | POA: Diagnosis not present

## 2022-08-15 DIAGNOSIS — R262 Difficulty in walking, not elsewhere classified: Secondary | ICD-10-CM | POA: Diagnosis not present

## 2022-08-15 DIAGNOSIS — M6281 Muscle weakness (generalized): Secondary | ICD-10-CM | POA: Diagnosis not present

## 2022-08-15 NOTE — Therapy (Signed)
OUTPATIENT PHYSICAL THERAPY THORACOLUMBAR PROGRESS NOTE   Patient Name: Samantha Steele MRN: [id number] DOB:May 24, 1951, 72 y.o., female Today's Date: 08/09/2022  END OF SESSION:  PT End of Session - 08/15/22 1101     Visit Number 4    Number of Visits 16    Authorization Type [employer] 16 visits  12/6-1/31/24    PT Start Time 1101    PT Stop Time 1144    PT Time Calculation (min) 43 min    Activity Tolerance Patient tolerated treatment well               Past Medical History:  Diagnosis Date   Hypertension    Osteopenia    Past Surgical History:  Procedure Laterality Date   TRIGGER FINGER RELEASE  2015   TRIGGER FINGER RELEASE Left 11/16/2015   Procedure: RELEASE TRIGGER FINGER/A-1 PULLEY LEFT MIDDLE FINGER;  Surgeon: Daryll Brod, MD;  Location: [facility];  Service: Orthopedics;  Laterality: Left;  middle finger   TRIGGER FINGER RELEASE Right 04/03/2017   Procedure: RELEASE TRIGGER FINGER/A-1 PULLEY RIGHT RING;  Surgeon: Daryll Brod, MD;  Location: [facility];  Service: Orthopedics;  Laterality: Right;   Patient Active Problem List   Diagnosis Date Noted   Atypical chest pain 12/21/2021   DVT (deep venous thrombosis) ([place]) 12/21/2021   Shortness of breath 12/21/2021   Submassive acute pulmonary embolism with acute DVT involving the right peroneal vein 09/02/2021   Essential hypertension 09/02/2021   Hyperlipidemia 09/02/2021   Chronic kidney disease 09/02/2021   Carotid artery disease ([place]) 09/02/2021    PCP: Lujean Amel MD  REFERRING PROVIDER: Lujean Amel MD  REFERRING DIAG: sciatic L3-L4 right leg pain M79.604  Rationale for Evaluation and Treatment: Rehabilitation  THERAPY DIAG:  right sciatica; weakness  ONSET DATE: August  SUBJECTIVE:                                                                                                                                                                                            SUBJECTIVE STATEMENT: I'm doing bad.  Gone back to not sleeping well.  B/c I'm flexible I tend to overstretch.  The stretch on the stairs bothered me.    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 No mostly at night NPRS scale: 0 Pain location: right anterior thigh mostly to knee  Aggravating factors: walking; lifting to change fitted sheet; initially sitting would bother especially rising Relieving factors: prednisone has helped finishes tomorrow  PRECAUTIONS: None  WEIGHT BEARING RESTRICTIONS: No  FALLS:  Has patient fallen in last 6 months? No  LIVING ENVIRONMENT: Lives with: lives with their spouse Lives in: House/apartment Stairs: Yes: Internal: 10 steps; can reach both Has following equipment at home: None  OCCUPATION: retired  PLOF: Arboriculturist, [place], writing, puzzles, reading; gardening   PATIENT GOALS: determine if current HEP are appropriate, strengthen hips, prevent impingement  NEXT MD VISIT:   OBJECTIVE:   DIAGNOSTIC FINDINGS:  If PT doesn't work will do MRI  PATIENT SURVEYS:  FOTO 58%   COGNITION: Overall cognitive status: Within functional limits for tasks assessed     MUSCLE LENGTH: Hamstrings: Right 90 deg; Left 90 deg Thomas test: Right 15 deg; Left 15 deg  POSTURE: decreased lumbar lordosis  PALPATION: Mild tender points in piriformis  LUMBAR ROM:   AROM eval  Flexion Fingertips to floor no pain  Extension 20  Right lateral flexion 30  Left lateral flexion 30  Right rotation   Left rotation    (Blank rows = not tested)  LOWER EXTREMITY ROM:   full passive hip and knee joint mobility  LOWER EXTREMITY MMT:  SLS left > 10 sec; right < 3 sec with  obvious pelvic drop With sidelying hip abduction left 4+/5, right with severe pain with attempted hip abduction (no resistance) 2/5 Pain with standing hip abduction on right   TRUNK STRENGTH:  Decreased activation of transverse abdominus muscles; abdominals 4-/5; decreased activation of lumbar multifidi; trunk extensors 4-/5;  quadruped bird dog pelvic drop with WB on right  LUMBAR SPECIAL TESTS:  Straight leg raise test: Negative, Slump test: Negative, and Single leg stance test: Positive No pain with prone knee bend test on right or left  FUNCTIONAL TESTS:  5 times sit to stand: 9.65 no hands Timed up and go (TUG): 7.85 no hands  GAIT:  Comments: with faster gait patient notes increased buttock/hip pain, decreased stance time on right noted  TODAY'S TREATMENT:      DATE: 08/15/22: Supine abdominal draw in 5x Hooklying Isometric Hip Flexion  5x2 - Supine Bent Leg Lift and lower 5x2  Standing hip abduction 2x 10 right only (painful to stand on right weight bearing) Standing hip extension 2x10 right only (painful with right weight bearing) Sit to stand feet at least shoulder width apart 10x (wider stance less painful) Standing green band rows 10x Standing green band shoulder extension 10x Standing green band shoulder diagonals 10x each way (minimal trunk rotation) Therapeutic activities:  sit to stand, standing, walking, pushing, pulling  Neuromuscular re-education: muscle activation with verbal and tactile cues to improve activation of gluteals and transverse abdominus muscles  DATE: 08/09/22  Nustep x 5 min level 3  Seated PPT x 10 Seated PPT with march x 20 Hooklying PPT x 10 Supine PPT with 90/90 heel tap x 20 PPT with dying bug Lower trunk rotation 10 each side  Hooklying Clamshell with Resistance  20 reps (patient c/o inner hip pain similar     DATE: 07/21/22  Supine abdominal draw in 5x Hooklying Isometric Hip Flexion  5x2 - Supine Bent Leg Lift and lower 5x2  - Hooklying Clamshell with Resistance  10 reps - Clam with Resistance   10 reps - Beginner Front Arm Support   5-10 reps - Quadruped on Forearms Hip Extension  5-10 reps - Supine Bridge 10 reps Attempted wall hip abduction isometric with pillow but too painful  - Standing Hip Abduction with Counter Support 10 reps Neuromuscular re-education: muscle activation with verbal and tactile cues to improve activation of gluteals and transverse abdominus muscles  PATIENT EDUCATION:  Education details: Educated patient on anatomy and physiology of current symptoms, prognosis, plan of care as well as initial self care strategies to promote recovery including anti-inflammatory strategies: meditation, exercise, diet and sleep  Person educated: Patient Education method: Explanation Education comprehension: verbalized understanding  HOME EXERCISE PROGRAM:  Access Code: [email or web address] URL: [email or web address] Date: 08/09/2022 Prepared by: Candyce Churn  Exercises - Hooklying Isometric Hip Flexion  - 1 x daily - 7 x weekly - 1 sets - 10 reps - Supine Bent Leg Lift with Knee Extension  - 1 x daily - 7 x weekly - 1 sets - 10 reps - Hooklying Clamshell with Resistance  - 1 x daily - 7 x weekly - 1 sets - 10 reps - Clam with Resistance  - 1 x daily - 7 x weekly - 1 sets - 10 reps - Supine Bridge  - 1 x daily - 7 x weekly - 1 sets - 10 reps - Standing Hip Abduction with Counter Support  - 1 x daily - 7 x weekly - 1 sets - 10 reps - Standing Hamstring Stretch on Chair  - 1 x daily - 7 x weekly - 1 sets - 3 reps - 30 sec hold - Standing Quad Stretch with Table and Chair Support  - 1 x daily - 7 x weekly - 1 sets - 3 reps - 30 sec hold - Butterfly Groin Stretch  - 1 x daily - 7 x weekly - 1 sets - 10 reps - 10 sec hold - Supine Posterior Pelvic Tilt  - 1 x daily - 7 x  weekly - 1 sets - 10 reps - Supine 90/90 Alternating Heel Touches with Posterior Pelvic Tilt  - 1 x daily - 7 x weekly - 2 sets - 10 reps - Supine Dead Bug with Leg Extension  - 1 x daily - 7 x weekly - 2 sets - 10 reps  ASSESSMENT:  CLINICAL IMPRESSION: The patient reports a negative response to stretching so focus of today's treatment was more on core stabilization.  Given her history of hypermobility she should respond well.  Modifications made to limit stance time on right LE secondary to high symptom irritability with weight bearing.  Modified squats to a sit to stand with a wide base of support for improved technique and less pain.  STGS not met secondary to recent exacerbation of symptoms.   OBJECTIVE IMPAIRMENTS: decreased activity tolerance, decreased mobility, difficulty walking, decreased strength, increased fascial restrictions, impaired perceived functional ability, and pain.   ACTIVITY LIMITATIONS: lifting,  transfers, and locomotion level  PARTICIPATION LIMITATIONS: community activity and yard work  PERSONAL FACTORS: 1-2 comorbidities: pulmonary emboli affecting conditioning and time since onset  are also affecting patient's functional outcome.   REHAB POTENTIAL: Good  CLINICAL DECISION MAKING: Stable/uncomplicated  EVALUATION COMPLEXITY: Low   GOALS: Goals reviewed with patient? Yes  SHORT TERM GOALS: Target date: 08/16/2022    The patient will demonstrate knowledge of basic self care strategies and exercises to promote healing   Baseline: Goal status: met 1/2 2.  The patient will report a 40% improvement in pain levels with functional activities which are currently difficult including walking, sit to stand, lateral movements Baseline:  Goal status: ongoing  3.  Improved right hip abduction strength to at least 3/5 needed for walking longer distances Baseline:  Goal status: ongoing  4.  Able to walk 6 min with pain level 5/10 Baseline:  Goal status:  ongoing  LONG TERM GOALS: Target date: 09/13/2022   The patient will be independent in a safe self progression of a home exercise program to promote further recovery of function   Baseline:  Goal status: INITIAL  2.  The patient will report a 75% improvement in pain levels with functional activities which are currently difficult including walking, sit to stand and lateral movements Baseline:  Goal status: INITIAL  3.  Right hip abduction strength grossly 3+/5 needed for longer distance walking Baseline:  Goal status: INITIAL  4. Able to ambulate 15 min with hip pain level 3/10   Baseline:  Goal status: INITIAL  5.  The patient will have improved FOTO score to   69%    indicating improved function with less pain  Baseline:  Goal status: INITIAL  PLAN:  PT FREQUENCY: 2x/week  PT DURATION: 8 weeks  PLANNED INTERVENTIONS: Therapeutic exercises, Therapeutic activity, Neuromuscular re-education, Balance training, Gait training, Patient/Family education, Self Care, Joint mobilization, Aquatic Therapy, Dry Needling, Electrical stimulation, Spinal manipulation, Spinal mobilization, Cryotherapy, Moist heat, Traction, Ultrasound, Ionotophoresis 18m/ml Dexamethasone, Manual therapy, and Re-evaluation.  PLAN FOR NEXT SESSION: sit to stand wide base of support;   moderate symptom irritability with right hip abduction; try staggered stance; lumbo/pelvic/hip core stabilization/strengthening  SRuben Im PT 08/15/22 5:38 PM Phone: [phone]: [id number] [place] [place] [address]Phone # [phone] [phone]

## 2022-08-17 ENCOUNTER — Ambulatory Visit: Payer: Medicare PPO | Admitting: Physical Therapy

## 2022-08-17 DIAGNOSIS — M6281 Muscle weakness (generalized): Secondary | ICD-10-CM | POA: Diagnosis not present

## 2022-08-17 DIAGNOSIS — M5431 Sciatica, right side: Secondary | ICD-10-CM

## 2022-08-17 DIAGNOSIS — R262 Difficulty in walking, not elsewhere classified: Secondary | ICD-10-CM | POA: Diagnosis not present

## 2022-08-17 NOTE — Therapy (Signed)
OUTPATIENT PHYSICAL THERAPY THORACOLUMBAR PROGRESS NOTE   Patient Name: Samantha Steele MRN: [id number] DOB:10-08-1950, 72 y.o., female Today's Date: 08/17/2022  END OF SESSION:  PT End of Session - 08/17/22 1139     Visit Number 5    Number of Visits 16    Date for PT Re-Evaluation 09/13/22    Authorization Type [employer] 16 visits  12/6-1/31/24    PT Start Time 3664    PT Stop Time 1225    PT Time Calculation (min) 43 min    Activity Tolerance Patient tolerated treatment well               Past Medical History:  Diagnosis Date   Hypertension    Osteopenia    Past Surgical History:  Procedure Laterality Date   TRIGGER FINGER RELEASE  2015   TRIGGER FINGER RELEASE Left 11/16/2015   Procedure: RELEASE TRIGGER FINGER/A-1 PULLEY LEFT MIDDLE FINGER;  Surgeon: Daryll Brod, MD;  Location: [facility];  Service: Orthopedics;  Laterality: Left;  middle finger   TRIGGER FINGER RELEASE Right 04/03/2017   Procedure: RELEASE TRIGGER FINGER/A-1 PULLEY RIGHT RING;  Surgeon: Daryll Brod, MD;  Location: [facility];  Service: Orthopedics;  Laterality: Right;   Patient Active Problem List   Diagnosis Date Noted   Atypical chest pain 12/21/2021   DVT (deep venous thrombosis) ([place]) 12/21/2021   Shortness of breath 12/21/2021   Submassive acute pulmonary embolism with acute DVT involving the right peroneal vein 09/02/2021   Essential hypertension 09/02/2021   Hyperlipidemia 09/02/2021   Chronic kidney disease 09/02/2021   Carotid artery disease ([place]) 09/02/2021    PCP: Lujean Amel MD  REFERRING PROVIDER: Lujean Amel MD  REFERRING DIAG: sciatic L3-L4 right leg pain M79.604  Rationale for Evaluation and Treatment: Rehabilitation  THERAPY DIAG:  right sciatica; weakness  ONSET DATE: August  SUBJECTIVE:                                                                                                                                                                                            SUBJECTIVE STATEMENT: Groin pain after last visit and it catches.  Slept better last night.    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 No mostly at night NPRS scale: 0 Pain location: right anterior thigh mostly to knee    Aggravating factors:  walking; lifting to change fitted sheet; initially sitting would bother especially rising Relieving factors: prednisone has helped finishes tomorrow  PRECAUTIONS: None  WEIGHT BEARING RESTRICTIONS: No  FALLS:  Has patient fallen in last 6 months? No  LIVING ENVIRONMENT: Lives with: lives with their spouse Lives in: House/apartment Stairs: Yes: Internal: 10 steps; can reach both Has following equipment at home: None  OCCUPATION: retired  PLOF: Arboriculturist, [place], writing, puzzles, reading; gardening   PATIENT GOALS: determine if current HEP are appropriate, strengthen hips, prevent impingement  NEXT MD VISIT:   OBJECTIVE:   DIAGNOSTIC FINDINGS:  If PT doesn't work will do MRI  PATIENT SURVEYS:  FOTO 58%   COGNITION: Overall cognitive status: Within functional limits for tasks assessed     MUSCLE LENGTH: Hamstrings: Right 90 deg; Left 90 deg Thomas test: Right 15 deg; Left 15 deg  POSTURE: decreased lumbar lordosis  PALPATION: Mild tender points in piriformis  LUMBAR ROM:   AROM eval  Flexion Fingertips to floor no pain  Extension 20  Right lateral flexion 30  Left lateral flexion 30  Right rotation   Left rotation    (Blank rows = not tested)  LOWER EXTREMITY ROM:   full passive hip and knee joint mobility  LOWER EXTREMITY MMT:  SLS left > 10 sec; right < 3 sec with obvious pelvic drop With  sidelying hip abduction left 4+/5, right with severe pain with attempted hip abduction (no resistance) 2/5 Pain with standing hip abduction on right   TRUNK STRENGTH:  Decreased activation of transverse abdominus muscles; abdominals 4-/5; decreased activation of lumbar multifidi; trunk extensors 4-/5;  quadruped bird dog pelvic drop with WB on right  LUMBAR SPECIAL TESTS:  Straight leg raise test: Negative, Slump test: Negative, and Single leg stance test: Positive No pain with prone knee bend test on right or left  FUNCTIONAL TESTS:  5 times sit to stand: 9.65 no hands Timed up and go (TUG): 7.85 no hands  GAIT:  Comments: with faster gait patient notes increased buttock/hip pain, decreased stance time on right noted  TODAY'S TREATMENT:      DATE: 08/17/22: Sidelying clam right with feet on wall 10x Sidelying hip abduction left with heel on wall 10x 2 - Supine Bent Leg Lift and lower 5x Prone on elbows and knees 5 sec hold 5x Prone over 1 pillow hip extension 5 sec hold 5x Standard stance Pallof series with green band 5x each: press out, press up, press out and up, stir the pot each way Pain education and time for expected improvement Discussed use of lumbar support for painful activities Therapeutic activities:  sit to stand, standing, walking, pushing, pulling  Neuromuscular re-education: muscle activation with verbal and tactile cues to improve activation of gluteals and transverse abdominus muscles  DATE: 08/09/22  Nustep x 5 min level 3  Seated PPT x 10 Seated PPT with march x 20 Hooklying PPT x 10 Supine PPT with 90/90 heel tap x 20 PPT with dying bug Lower trunk rotation 10 each side  Hooklying Clamshell with Resistance  20 reps (patient c/o inner hip pain similar    DATE: 07/21/22  Supine abdominal draw in 5x Hooklying Isometric Hip  Flexion  5x2 - Supine Bent Leg Lift and lower 5x2  - Hooklying Clamshell with Resistance  10 reps - Clam with Resistance   10 reps - Beginner Front Arm Support   5-10 reps - Quadruped on Forearms Hip Extension  5-10 reps - Supine Bridge 10 reps Attempted wall hip abduction isometric with pillow but too painful  - Standing Hip Abduction with Counter Support 10 reps Neuromuscular re-education: muscle activation with verbal and tactile cues to improve activation of gluteals and transverse abdominus muscles  PATIENT EDUCATION:  Education details: Educated patient on anatomy and physiology of current symptoms, prognosis, plan of care as well as initial self care strategies to promote recovery including anti-inflammatory strategies: meditation, exercise, diet and sleep  Person educated: Patient Education method: Explanation Education comprehension: verbalized understanding  HOME EXERCISE PROGRAM: Patient given green band with handle Access Code: [email or web address] URL: [email or web address] Date: 08/17/2022 Prepared by: Ruben Im  Exercises - Hooklying Isometric Hip Flexion  - 1 x daily - 7 x weekly - 1 sets - 10 reps - Supine Bent Leg Lift with Knee Extension  - 1 x daily - 7 x weekly - 1 sets - 10 reps - Hooklying Clamshell with Resistance  - 1 x daily - 7 x weekly - 1 sets - 10 reps - Clam with Resistance  - 1 x daily - 7 x weekly - 1 sets - 10 reps - Supine Bridge  - 1 x daily - 7 x weekly - 1 sets - 10 reps - Supine Posterior Pelvic Tilt  - 1 x daily - 7 x weekly - 1 sets - 10 reps - Supine 90/90 Alternating Heel Touches with Posterior Pelvic Tilt  - 1 x daily - 7 x weekly - 2 sets - 10 reps - Supine Dead Bug with Leg Extension  - 1 x daily - 7 x weekly - 2 sets - 10 reps - Clamshell  - 1 x daily - 7 x weekly - 1 sets - 10 reps - Sidelying Hip Abduction (Mirrored)  - 1 x daily - 7 x weekly - 1 sets - 10 reps - Plank on Knees  - 1 x daily - 7 x weekly - 1 sets - 5 reps - 5  hold - Prone Hip Extension - One Pillow  - 1 x daily - 7 x weekly - 1 sets - 5 reps - 5 hold - Standing Anti-Rotation Press with Anchored Resistance  - 1 x daily - 7 x weekly - 1 sets - 5 reps ASSESSMENT:  CLINICAL IMPRESSION: Updated HEP to eliminate painful exs and to include new stabilization ex's.  We discussed acceptable levels of pain with exercise (below 5/10) and expected time for recovery.  Verbal and tactile cues to avoid compensatory strategies.  STGS not met secondary to higher symptom irritability and flare up.     OBJECTIVE IMPAIRMENTS: decreased activity tolerance, decreased mobility, difficulty walking, decreased strength, increased fascial restrictions, impaired perceived functional ability, and pain.   ACTIVITY LIMITATIONS: lifting, transfers, and locomotion level  PARTICIPATION LIMITATIONS: community activity and yard work  PERSONAL FACTORS: 1-2 comorbidities: pulmonary emboli affecting conditioning and time since onset  are also affecting patient's functional outcome.   REHAB POTENTIAL: Good  CLINICAL DECISION MAKING: Stable/uncomplicated  EVALUATION COMPLEXITY: Low   GOALS: Goals reviewed with patient? Yes  SHORT TERM GOALS: Target date: 08/16/2022    The patient will demonstrate knowledge of basic self care strategies and exercises to promote healing   Baseline: Goal status: met 1/2 2.  The patient will report a 40% improvement in pain levels with functional activities which are currently difficult including walking, sit to stand, lateral movements Baseline:  Goal status: ongoing  3.  Improved right hip abduction strength to at least 3/5 needed for walking longer distances Baseline:  Goal status: ongoing  4.  Able to walk 6 min with pain level 5/10 Baseline:  Goal status: ongoing  LONG TERM GOALS: Target date: 09/13/2022   The patient will be independent in a safe self progression of a home exercise program to promote further recovery of function    Baseline:  Goal status: INITIAL  2.  The patient will report a 75% improvement in pain levels with functional activities which are currently difficult including walking, sit to stand and lateral movements Baseline:  Goal status: INITIAL  3.  Right hip abduction strength grossly 3+/5 needed for longer distance walking Baseline:  Goal status: INITIAL  4. Able to ambulate 15 min with hip pain level 3/10   Baseline:  Goal status: INITIAL  5.  The patient will have improved FOTO score to   69%    indicating improved function with less pain  Baseline:  Goal status: INITIAL  PLAN:  PT FREQUENCY: 2x/week  PT DURATION: 8 weeks  PLANNED INTERVENTIONS: Therapeutic exercises, Therapeutic activity, Neuromuscular re-education, Balance training, Gait training, Patient/Family education, Self Care, Joint mobilization, Aquatic Therapy, Dry Needling, Electrical stimulation, Spinal manipulation, Spinal mobilization, Cryotherapy, Moist heat, Traction, Ultrasound, Ionotophoresis 80m/ml Dexamethasone, Manual therapy, and Re-evaluation.  PLAN FOR NEXT SESSION: check STGS;  review updated HEP;  Pallof style ex standard stance with progression to standard stance; prone over 1 pillow; modified planks; sit to stand wide base of support;   moderate symptom irritability with right hip abduction; lumbo/pelvic/hip core stabilization/strengthening  SRuben Im PT 08/17/22 12:47 PM Phone: [phone]: [id number] [place] [place] [address]Phone # [phone] [phone]

## 2022-08-22 ENCOUNTER — Encounter: Payer: Medicare PPO | Admitting: Physical Therapy

## 2022-08-24 ENCOUNTER — Encounter: Payer: Medicare PPO | Admitting: Physical Therapy

## 2022-08-29 ENCOUNTER — Ambulatory Visit: Payer: Medicare PPO | Admitting: Physical Therapy

## 2022-08-29 DIAGNOSIS — R262 Difficulty in walking, not elsewhere classified: Secondary | ICD-10-CM

## 2022-08-29 DIAGNOSIS — M6281 Muscle weakness (generalized): Secondary | ICD-10-CM

## 2022-08-29 DIAGNOSIS — M5431 Sciatica, right side: Secondary | ICD-10-CM

## 2022-08-29 NOTE — Therapy (Signed)
OUTPATIENT PHYSICAL THERAPY THORACOLUMBAR PROGRESS NOTE   Patient Name: Samantha Steele MRN: [id number] DOB:1951/06/02, 72 y.o., female Today's Date: 08/29/2022  END OF SESSION:  PT End of Session - 08/29/22 1055     Visit Number 6    Number of Visits 16    Date for PT Re-Evaluation 09/13/22    Authorization Type [employer] 16 visits  12/6-1/31/24    PT Start Time 1100    PT Stop Time 1140    PT Time Calculation (min) 40 min    Activity Tolerance Patient tolerated treatment well               Past Medical History:  Diagnosis Date   Hypertension    Osteopenia    Past Surgical History:  Procedure Laterality Date   TRIGGER FINGER RELEASE  2015   TRIGGER FINGER RELEASE Left 11/16/2015   Procedure: RELEASE TRIGGER FINGER/A-1 PULLEY LEFT MIDDLE FINGER;  Surgeon: Daryll Brod, MD;  Location: [facility];  Service: Orthopedics;  Laterality: Left;  middle finger   TRIGGER FINGER RELEASE Right 04/03/2017   Procedure: RELEASE TRIGGER FINGER/A-1 PULLEY RIGHT RING;  Surgeon: Daryll Brod, MD;  Location: [place];  Service: Orthopedics;  Laterality: Right;   Patient Active Problem List   Diagnosis Date Noted   Atypical chest pain 12/21/2021   DVT (deep venous thrombosis) ([place]) 12/21/2021   Shortness of breath 12/21/2021   Submassive acute pulmonary embolism with acute DVT involving the right peroneal vein 09/02/2021   Essential hypertension 09/02/2021   Hyperlipidemia 09/02/2021   Chronic kidney disease 09/02/2021   Carotid artery disease ([place]) 09/02/2021    PCP: Lujean Amel MD  REFERRING PROVIDER: Lujean Amel MD  REFERRING DIAG: sciatic L3-L4 right leg pain M79.604  Rationale for Evaluation and Treatment: Rehabilitation  THERAPY DIAG:  right sciatica; weakness  ONSET DATE: August  SUBJECTIVE:                                                                                                                                                                                            SUBJECTIVE STATEMENT: The deep squats aggravate me.  The pallof press bothers mid back.  Supine leg lowers bother left groin.  Stopped doing those ex's.    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 Yes NPRS scale: 4 left groin Pain location: right  anterior thigh mostly to knee    Aggravating factors: walking; lifting to change fitted sheet; initially sitting would bother especially rising Relieving factors: prednisone has helped finishes tomorrow  PRECAUTIONS: None  WEIGHT BEARING RESTRICTIONS: No  FALLS:  Has patient fallen in last 6 months? No  LIVING ENVIRONMENT: Lives with: lives with their spouse Lives in: House/apartment Stairs: Yes: Internal: 10 steps; can reach both Has following equipment at home: None  OCCUPATION: retired  PLOF: Systems analyst, quilting, writing, puzzles, reading; gardening   PATIENT GOALS: determine if current HEP are appropriate, strengthen hips, prevent impingement  NEXT MD VISIT:   OBJECTIVE:   DIAGNOSTIC FINDINGS:  If PT doesn't work will do MRI  PATIENT SURVEYS:  FOTO 58%   COGNITION: Overall cognitive status: Within functional limits for tasks assessed     MUSCLE LENGTH: Hamstrings: Right 90 deg; Left 90 deg Thomas test: Right 15 deg; Left 15 deg  POSTURE: decreased lumbar lordosis  PALPATION: Mild tender points in piriformis  LUMBAR ROM:   AROM eval  Flexion Fingertips to floor no pain  Extension 20  Right lateral flexion 30  Left lateral flexion 30  Right rotation   Left rotation    (Blank rows = not tested)  LOWER EXTREMITY ROM:   full passive hip and knee joint mobility  LOWER EXTREMITY MMT:  SLS left  > 10 sec; right < 3 sec with obvious pelvic drop With sidelying hip abduction left 4+/5, right with severe pain with attempted hip abduction (no resistance) 2/5 Pain with standing hip abduction on right   TRUNK STRENGTH:  Decreased activation of transverse abdominus muscles; abdominals 4-/5; decreased activation of lumbar multifidi; trunk extensors 4-/5;  quadruped bird dog pelvic drop with WB on right  LUMBAR SPECIAL TESTS:  Straight leg raise test: Negative, Slump test: Negative, and Single leg stance test: Positive No pain with prone knee bend test on right or left  FUNCTIONAL TESTS:  5 times sit to stand: 9.65 no hands Timed up and go (TUG): 7.85 no hands  GAIT:  Comments: with faster gait patient notes increased buttock/hip pain, decreased stance time on right noted  TODAY'S TREATMENT:       DATE: 08/29/22: Comprehensive review of HEP: omit full leg extension lowering; hold on bridge Supine red band bil hip abduction/external rotation 15x Standard stance Pallof series with green band 5x each: press out, press up, press out and up, stir the pot each way emphasized abdominal brace and small movements with stir the pot Standing green band rows with emphasis on scap retraction and not hyperextending the shoulder Pain education on brain hypersensitivity Anti-inflammatory strategies: meditation, exercise, diet, sleep Therapeutic activities:  sit to stand, standing, walking, pushing, pulling  Neuromuscular re-education: muscle activation with verbal and tactile cues to improve activation of gluteals and transverse abdominus muscles        DATE: 08/17/22: Sidelying clam right with feet on wall 10x Sidelying hip abduction left with heel on wall 10x 2 - Supine Bent Leg Lift and lower 5x Prone on elbows and knees 5 sec hold 5x Prone over 1 pillow hip extension 5 sec hold 5x Standard stance Pallof series with green band 5x each: press out, press up, press out and up, stir the pot each  way Pain education and time for expected improvement Discussed use of lumbar support for painful activities Therapeutic activities:  sit to stand, standing, walking, pushing, pulling  Neuromuscular re-education: muscle activation with verbal and tactile cues to improve activation of  gluteals and transverse abdominus muscles                                                                                                                                   DATE: 08/09/22  Nustep x 5 min level 3  Seated PPT x 10 Seated PPT with march x 20 Hooklying PPT x 10 Supine PPT with 90/90 heel tap x 20 PPT with dying bug Lower trunk rotation 10 each side  Hooklying Clamshell with Resistance  20 reps (patient c/o inner hip pain similar    DATE: 07/21/22  Supine abdominal draw in 5x Hooklying Isometric Hip Flexion  5x2 - Supine Bent Leg Lift and lower 5x2  - Hooklying Clamshell with Resistance  10 reps - Clam with Resistance   10 reps - Beginner Front Arm Support   5-10 reps - Quadruped on Forearms Hip Extension  5-10 reps - Supine Bridge 10 reps Attempted wall hip abduction isometric with pillow but too painful  - Standing Hip Abduction with Counter Support 10 reps Neuromuscular re-education: muscle activation with verbal and tactile cues to improve activation of gluteals and transverse abdominus muscles  PATIENT EDUCATION:  Education details: Educated patient on anatomy and physiology of current symptoms, prognosis, plan of care as well as initial self care strategies to promote recovery including anti-inflammatory strategies: meditation, exercise, diet and sleep  Person educated: Patient Education method: Explanation Education comprehension: verbalized understanding  HOME EXERCISE PROGRAM: Patient given green band with handle Access Code: [email or web address] URL: [email or web address] Date: 08/17/2022 Prepared by: Ruben Im  Exercises - Hooklying Isometric Hip Flexion  - 1  x daily - 7 x weekly - 1 sets - 10 reps - Supine Bent Leg Lift with Knee Extension  - 1 x daily - 7 x weekly - 1 sets - 10 reps - Hooklying Clamshell with Resistance  - 1 x daily - 7 x weekly - 1 sets - 10 reps - Clam with Resistance  - 1 x daily - 7 x weekly - 1 sets - 10 reps - Supine Bridge  - 1 x daily - 7 x weekly - 1 sets - 10 reps - Supine Posterior Pelvic Tilt  - 1 x daily - 7 x weekly - 1 sets - 10 reps - Supine 90/90 Alternating Heel Touches with Posterior Pelvic Tilt  - 1 x daily - 7 x weekly - 2 sets - 10 reps - Supine Dead Bug with Leg Extension  - 1 x daily - 7 x weekly - 2 sets - 10 reps - Clamshell  - 1 x daily - 7 x weekly - 1 sets - 10 reps - Sidelying Hip Abduction (Mirrored)  - 1 x daily - 7 x weekly - 1 sets - 10 reps - Plank on Knees  - 1 x daily - 7 x weekly - 1 sets - 5 reps - 5 hold - Prone Hip Extension - One Pillow  -  1 x daily - 7 x weekly - 1 sets - 5 reps - 5 hold - Standing Anti-Rotation Press with Anchored Resistance  - 1 x daily - 7 x weekly - 1 sets - 5 reps ASSESSMENT:  CLINICAL IMPRESSION: Therapist modifying position of exercise, intensity including number of repetitions and challenge level based higher pain levels over the last few days.  Pain education on calming the central nervous system.  Updating HEP to reflect modifications based on current symptoms.   Slower to meet goals secondary to central sensitization.     OBJECTIVE IMPAIRMENTS: decreased activity tolerance, decreased mobility, difficulty walking, decreased strength, increased fascial restrictions, impaired perceived functional ability, and pain.   ACTIVITY LIMITATIONS: lifting, transfers, and locomotion level  PARTICIPATION LIMITATIONS: community activity and yard work  PERSONAL FACTORS: 1-2 comorbidities: pulmonary emboli affecting conditioning and time since onset  are also affecting patient's functional outcome.   REHAB POTENTIAL: Good  CLINICAL DECISION MAKING:  Stable/uncomplicated  EVALUATION COMPLEXITY: Low   GOALS: Goals reviewed with patient? Yes  SHORT TERM GOALS: Target date: 08/16/2022    The patient will demonstrate knowledge of basic self care strategies and exercises to promote healing   Baseline: Goal status: met 1/2 2.  The patient will report a 40% improvement in pain levels with functional activities which are currently difficult including walking, sit to stand, lateral movements Baseline:  Goal status: ongoing  3.  Improved right hip abduction strength to at least 3/5 needed for walking longer distances Baseline:  Goal status: ongoing  4.  Able to walk 6 min with pain level 5/10 Baseline:  Goal status: ongoing  LONG TERM GOALS: Target date: 09/13/2022   The patient will be independent in a safe self progression of a home exercise program to promote further recovery of function   Baseline:  Goal status: INITIAL  2.  The patient will report a 75% improvement in pain levels with functional activities which are currently difficult including walking, sit to stand and lateral movements Baseline:  Goal status: INITIAL  3.  Right hip abduction strength grossly 3+/5 needed for longer distance walking Baseline:  Goal status: INITIAL  4. Able to ambulate 15 min with hip pain level 3/10   Baseline:  Goal status: INITIAL  5.  The patient will have improved FOTO score to   69%    indicating improved function with less pain  Baseline:  Goal status: INITIAL  PLAN:  PT FREQUENCY: 2x/week  PT DURATION: 8 weeks  PLANNED INTERVENTIONS: Therapeutic exercises, Therapeutic activity, Neuromuscular re-education, Balance training, Gait training, Patient/Family education, Self Care, Joint mobilization, Aquatic Therapy, Dry Needling, Electrical stimulation, Spinal manipulation, Spinal mobilization, Cryotherapy, Moist heat, Traction, Ultrasound, Ionotophoresis 4mg /ml Dexamethasone, Manual therapy, and Re-evaluation.  PLAN FOR NEXT  SESSION:  review updated HEP;  Pallof style ex standard stance with progression to standard stance; prone over 1 pillow; modified planks; sit to stand wide base of support;   moderate symptom irritability with right hip abduction; lumbo/pelvic/hip core stabilization/strengthening   , PT 08/29/22 6:56 PM Phone: [phone] Fax: [phone]  [facility] [address], [address] [place], [place] Phone # [phone] Fax [phone]

## 2022-08-31 DIAGNOSIS — Z9189 Other specified personal risk factors, not elsewhere classified: Secondary | ICD-10-CM | POA: Diagnosis not present

## 2022-08-31 DIAGNOSIS — Z7251 High risk heterosexual behavior: Secondary | ICD-10-CM | POA: Diagnosis not present

## 2022-08-31 DIAGNOSIS — N952 Postmenopausal atrophic vaginitis: Secondary | ICD-10-CM | POA: Diagnosis not present

## 2022-09-05 ENCOUNTER — Ambulatory Visit: Payer: Medicare PPO | Admitting: Physical Therapy

## 2022-09-05 DIAGNOSIS — M6281 Muscle weakness (generalized): Secondary | ICD-10-CM

## 2022-09-05 DIAGNOSIS — M5431 Sciatica, right side: Secondary | ICD-10-CM

## 2022-09-05 DIAGNOSIS — R262 Difficulty in walking, not elsewhere classified: Secondary | ICD-10-CM | POA: Diagnosis not present

## 2022-09-05 NOTE — Therapy (Signed)
OUTPATIENT PHYSICAL THERAPY THORACOLUMBAR PROGRESS [place] SUMMARY   Patient Name: Samantha Steele MRN: [id number] DOB:1950-10-09, 72 y.o., female Today's Date: 09/05/2022  END OF SESSION:  PT End of Session - 09/05/22 1056     Visit Number 7    Number of Visits 16    Date for PT Re-Evaluation 09/13/22    Authorization Type [employer] 16 visits  12/6-1/31/24    PT Start Time 1100    PT Stop Time 1140    PT Time Calculation (min) 40 min    Activity Tolerance Patient tolerated treatment well               Past Medical History:  Diagnosis Date   Hypertension    Osteopenia    Past Surgical History:  Procedure Laterality Date   TRIGGER FINGER RELEASE  2015   TRIGGER FINGER RELEASE Left 11/16/2015   Procedure: RELEASE TRIGGER FINGER/A-1 PULLEY LEFT MIDDLE FINGER;  Surgeon: Daryll Brod, MD;  Location: [place];  Service: Orthopedics;  Laterality: Left;  middle finger   TRIGGER FINGER RELEASE Right 04/03/2017   Procedure: RELEASE TRIGGER FINGER/A-1 PULLEY RIGHT RING;  Surgeon: Daryll Brod, MD;  Location: [facility];  Service: Orthopedics;  Laterality: Right;   Patient Active Problem List   Diagnosis Date Noted   Atypical chest pain 12/21/2021   DVT (deep venous thrombosis) ([place]) 12/21/2021   Shortness of breath 12/21/2021   Submassive acute pulmonary embolism with acute DVT involving the right peroneal vein 09/02/2021   Essential hypertension 09/02/2021   Hyperlipidemia 09/02/2021   Chronic kidney disease 09/02/2021   Carotid artery disease ([place]) 09/02/2021    PCP: Lujean Amel MD  REFERRING PROVIDER: Lujean Amel MD  REFERRING DIAG: sciatic L3-L4 right leg pain M79.604  Rationale for Evaluation and Treatment: Rehabilitation  THERAPY DIAG:  right sciatica; weakness  ONSET DATE: August  SUBJECTIVE:                                                                                                                                                                                            SUBJECTIVE STATEMENT: Going OK.  Walked around the block yesterday for the first time in 2-3 weeks.  My torso felt like I had more energy but my legs bothered me (groin pain).  The sciatica is better.  Sleeping pretty well.  Shoulder extension bothers my shoulder.    PERTINENT HISTORY:  HTN;  hx of DVT right leg with multiple pulmonary emboli Jan 2023; osteopenia left hip on edge of osteoporosis Reports hypermobility when younger Max walk 6 blocks but mostly around the block Husband had a stroke this year  Shoulder pain with planks Tai Chi PAIN:  PAIN:  Are you having pain? Yes NPRS scale: 4 left groin Pain location: right anterior thigh mostly to knee    Aggravating factors: walking; lifting to change fitted sheet; initially sitting would bother especially rising Relieving factors: prednisone has helped finishes tomorrow  PRECAUTIONS: None  WEIGHT BEARING RESTRICTIONS: No  FALLS:  Has patient fallen in last 6 months? No  LIVING ENVIRONMENT: Lives with: lives with their spouse Lives in: House/apartment Stairs: Yes: Internal: 10 steps; can reach both Has following equipment at home: None  OCCUPATION: retired  PLOF: Systems analyst, quilting, writing, puzzles, reading; gardening   PATIENT GOALS: determine if current HEP are appropriate, strengthen hips, prevent impingement  NEXT MD VISIT:   OBJECTIVE:   DIAGNOSTIC FINDINGS:  If PT doesn't work will do MRI  PATIENT SURVEYS:  FOTO 58%  1/23: 58% COGNITION: Overall cognitive status: Within functional limits for tasks assessed     MUSCLE LENGTH: Hamstrings: Right 90 deg; Left 90 deg Thomas test: Right 15 deg; Left 15 deg  POSTURE: decreased lumbar lordosis  PALPATION: Mild tender points in piriformis  LUMBAR ROM:   AROM eval 1/23  Flexion Fingertips to floor no pain full  Extension 20 full  Right lateral flexion 30 full  Left lateral flexion 30 full   Right rotation    Left rotation     (Blank rows = not tested)  LOWER EXTREMITY ROM:   full passive hip and knee joint mobility  LOWER EXTREMITY MMT:  SLS left > 10 sec; right < 3 sec with obvious pelvic drop With sidelying hip abduction left 4+/5, right with severe pain with attempted hip abduction (no resistance) 2/5 Pain with standing hip abduction on right  1/23:  Right strength 3+/5 TRUNK STRENGTH:  Decreased activation of transverse abdominus muscles; abdominals 4-/5; decreased activation of lumbar multifidi; trunk extensors 4-/5;  quadruped bird dog pelvic drop with WB on right  LUMBAR SPECIAL TESTS:  Straight leg raise test: Negative, Slump test: Negative, and Single leg stance test: Positive No pain with prone knee bend test on right or left  FUNCTIONAL TESTS:  5 times sit to stand: 9.65 no hands Timed up and go (TUG): 7.85 no hands 1/23: TUG: 6.63 no hands Defers 5x sit to stand secondary to left groin pain with repeated movements GAIT:  Comments: with faster gait patient notes increased buttock/hip pain, decreased stance time on right noted  TODAY'S TREATMENT:      DATE: 09/05/22: Comprehensive review of HEP FOTO TUG Progress toward goals Red band seated LAQ: fixed on chair leg or anchored with other leg (added to HEP) Modified sit to stands (higher seat height for quad focus)   DATE: 08/29/22: Comprehensive review of HEP: omit full leg extension lowering; hold on bridge Supine red band bil hip abduction/external rotation 15x Standard stance Pallof series with green band 5x each: press out, press up, press out and up, stir the pot each way emphasized abdominal brace and small movements with stir the pot Standing green band rows with emphasis on scap retraction and not hyperextending the shoulder Pain education on brain hypersensitivity Anti-inflammatory strategies: meditation, exercise, diet, sleep Therapeutic activities:  sit to stand, standing, walking, pushing,  pulling  Neuromuscular re-education: muscle activation with verbal and tactile cues to improve activation of gluteals and transverse abdominus muscles        DATE: 08/17/22: Sidelying clam right with feet on wall 10x Sidelying hip abduction left with heel on wall  10x 2 - Supine Bent Leg Lift and lower 5x Prone on elbows and knees 5 sec hold 5x Prone over 1 pillow hip extension 5 sec hold 5x Standard stance Pallof series with green band 5x each: press out, press up, press out and up, stir the pot each way Pain education and time for expected improvement Discussed use of lumbar support for painful activities Therapeutic activities:  sit to stand, standing, walking, pushing, pulling  Neuromuscular re-education: muscle activation with verbal and tactile cues to improve activation of gluteals and transverse abdominus muscles      PATIENT EDUCATION:  Education details: Educated patient on anatomy and physiology of current symptoms, prognosis, plan of care as well as initial self care strategies to promote recovery including anti-inflammatory strategies: meditation, exercise, diet and sleep  Person educated: Patient Education method: Explanation Education comprehension: verbalized understanding  HOME EXERCISE PROGRAM:   Access Code: [email or web address] URL: [email or web address] Date: 09/05/2022 Prepared by: Ruben Im  Exercises - Hooklying Isometric Hip Flexion  - 1 x daily - 7 x weekly - 1 sets - 10 reps - Supine Bent Leg Lift with Knee Extension  - 1 x daily - 7 x weekly - 1 sets - 10 reps - Hooklying Clamshell with Resistance  - 1 x daily - 7 x weekly - 1 sets - 10 reps - Clam with Resistance  - 1 x daily - 7 x weekly - 1 sets - 10 reps - Supine Bridge  - 1 x daily - 7 x weekly - 1 sets - 10 reps - Supine Posterior Pelvic Tilt  - 1 x daily - 7 x weekly - 1 sets - 10 reps - Supine 90/90 Alternating Heel Touches with Posterior Pelvic Tilt  - 1 x daily - 7 x weekly - 2 sets  - 10 reps - Supine Dead Bug with Leg Extension  - 1 x daily - 7 x weekly - 2 sets - 10 reps - Clamshell  - 1 x daily - 7 x weekly - 1 sets - 10 reps - Sidelying Hip Abduction (Mirrored)  - 1 x daily - 7 x weekly - 1 sets - 10 reps - Plank on Knees  - 1 x daily - 7 x weekly - 1 sets - 5 reps - 5 hold - Prone Hip Extension - One Pillow  - 1 x daily - 7 x weekly - 1 sets - 5 reps - 5 hold - Standing Anti-Rotation Press with Anchored Resistance  - 1 x daily - 7 x weekly - 1 sets - 5 reps - Sitting Knee Extension with Resistance (Mirrored)  - 1 x daily - 7 x weekly - 1-2 sets - 10 reps - Seated Knee Extension with Resistance  - 1 x daily - 7 x weekly - 1-2 sets - 10 reps Patient given green band with handle ASSESSMENT:  CLINICAL IMPRESSION: The patient reports her right sciatic pain is 85% improved since start of care with improved sleep and decreased medicine usage however she has developed left groin pain impacting her function.  Her FOTO outcome score did not change but could be attributed to this left sided pain development.  She has made improvements with ROM, functional tests and strength.  She is independent with a comprehensive HEP which has been modified often based on symptoms.  She expresses readiness for discharge at this time and should make further improvements as she continues with this home ex program.     OBJECTIVE IMPAIRMENTS: decreased activity tolerance,  decreased mobility, difficulty walking, decreased strength, increased fascial restrictions, impaired perceived functional ability, and pain.   ACTIVITY LIMITATIONS: lifting, transfers, and locomotion level  PARTICIPATION LIMITATIONS: community activity and yard work  PERSONAL FACTORS: 1-2 comorbidities: pulmonary emboli affecting conditioning and time since onset  are also affecting patient's functional outcome.   REHAB POTENTIAL: Good  CLINICAL DECISION MAKING: Stable/uncomplicated  EVALUATION COMPLEXITY:  Low   GOALS: Goals reviewed with patient? Yes  SHORT TERM GOALS: Target date: 08/16/2022    The patient will demonstrate knowledge of basic self care strategies and exercises to promote healing   Baseline: Goal status: met 1/2 2.  The patient will report a 40% improvement in pain levels with functional activities which are currently difficult including walking, sit to stand, lateral movements Baseline:  Goal status: met  3.  Improved right hip abduction strength to at least 3/5 needed for walking longer distances Baseline:  Goal status: met  4.  Able to walk 6 min with pain level 5/10 Baseline:  Goal status: met LONG TERM GOALS: Target date: 09/13/2022   The patient will be independent in a safe self progression of a home exercise program to promote further recovery of function   Baseline:  Goal status: met 1/23 2.  The patient will report a 75% improvement in pain levels with functional activities which are currently difficult including walking, sit to stand and lateral movements Baseline:  Goal status: sciatica is 85% better  3.  Right hip abduction strength grossly 3+/5 needed for longer distance walking Baseline:  Goal status:  partially met  4. Able to ambulate 15 min with hip pain level 3/10   Baseline:  Goal status:  partially met  5.  The patient will have improved FOTO score to   69%    indicating improved function with less pain  Baseline:  Goal status: not met  PHYSICAL THERAPY DISCHARGE SUMMARY  Visits from Start of Care: 7  Current functional level related to goals / functional outcomes: See clinical impressions above   Remaining deficits: As above   Education / Equipment: HEP   Patient agrees to discharge. Patient goals were partially met. Patient is being discharged due to being pleased with the current functional level.    Lavinia Sharps, PT 09/05/22 8:50 PM Phone: [phone] Fax: [phone]  [facility] [address] [place], [place] [address] Phone # [phone] Fax [phone]

## 2022-09-08 ENCOUNTER — Inpatient Hospital Stay: Payer: Medicare PPO | Attending: Oncology | Admitting: Oncology

## 2022-09-08 ENCOUNTER — Other Ambulatory Visit: Payer: Self-pay | Admitting: *Deleted

## 2022-09-08 VITALS — BP 146/77 | HR 76 | Temp 98.2°F | Resp 18 | Ht 63.0 in | Wt 156.0 lb

## 2022-09-08 DIAGNOSIS — Z86711 Personal history of pulmonary embolism: Secondary | ICD-10-CM | POA: Insufficient documentation

## 2022-09-08 DIAGNOSIS — Z7901 Long term (current) use of anticoagulants: Secondary | ICD-10-CM | POA: Insufficient documentation

## 2022-09-08 DIAGNOSIS — Z86718 Personal history of other venous thrombosis and embolism: Secondary | ICD-10-CM | POA: Diagnosis not present

## 2022-09-08 DIAGNOSIS — I2699 Other pulmonary embolism without acute cor pulmonale: Secondary | ICD-10-CM

## 2022-09-08 DIAGNOSIS — I10 Essential (primary) hypertension: Secondary | ICD-10-CM | POA: Insufficient documentation

## 2022-09-08 MED ORDER — APIXABAN 2.5 MG PO TABS: ORAL_TABLET | ORAL | 0 refills | Status: DC

## 2022-09-08 NOTE — Progress Notes (Signed)
  [facility] OFFICE PROGRESS NOTE   Diagnosis: DVT/pulmonary embolism  INTERVAL HISTORY:   Samantha Steele returns as scheduled.  She generally feels well.  She continues apixaban anticoagulation.  She has occasional bleeding with brushing her teeth.  She reports chronic mild bleeding when she strains for a bowel movement, but this has not occurred recently.  No symptom of recurrent DVT or pulmonary embolism.  She reports an intermittent cough since being diagnosed with pulmonary embolism last year.  Objective:  Vital signs in last 24 hours:  Blood pressure (!) 146/77, pulse 76, temperature 98.2 F (36.8 C), temperature source Oral, resp. rate 18, height 5\' 3"  (1.6 m), weight 156 lb (70.8 kg), SpO2 98 %.    Lymphatics: No cervical, supraclavicular, axillary, or inguinal nodes Resp: Lungs with end inspiratory coarse rhonchi at the left greater than right posterior lateral base that cleared after several respirations, no respiratory distress Cardio: Regular rate and rhythm GI: No hepatosplenomegaly Vascular: No leg edema   Lab Results  Component Value Date   WBC 5.3 01/10/2022   HGB 14.1 01/10/2022   HCT 43.4 01/10/2022   MCV 92.3 01/10/2022   PLT 229 01/10/2022   NEUTROABS 5.0 12/05/2021    CMP  Lab Results  Component Value Date   NA 138 01/10/2022   K 4.2 01/10/2022   CL 104 01/10/2022   CO2 26 01/10/2022   GLUCOSE 100 (H) 01/10/2022   BUN 16 01/10/2022   CREATININE 1.08 (H) 01/10/2022   CALCIUM 9.6 01/10/2022   PROT 6.6 12/05/2021   ALBUMIN 4.3 12/05/2021   AST 41 12/05/2021   ALT 52 (H) 12/05/2021   ALKPHOS 59 12/05/2021   BILITOT 0.5 12/05/2021   GFRNONAA 55 (L) 01/10/2022     Medications: I have reviewed the patient's current medications.   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  She continues reduced intensity apixaban anticoagulation.  There is no symptom of recurrent venous thrombosis.  She appears to have developed an unprovoked DVT/pulmonary embolism last year, though it is possible she had COVID-19 prior to the venous thrombosis.  She is most comfortable continuing indefinite anticoagulation therapy.  She plans to continue clinical follow-up with Dr. Dorthy Cooler.  She will remain up-to-date on mammography and colon cancer screening.  She will seek medical attention for increased cough. I am available to see her as needed.  Betsy Coder, MD  09/08/2022  11:17 AM

## 2022-09-08 NOTE — Telephone Encounter (Signed)
90 day supply Eliquis sent in per Dr. Benay Spice request.

## 2022-09-12 ENCOUNTER — Encounter: Payer: Medicare PPO | Admitting: Physical Therapy

## 2022-10-17 DIAGNOSIS — Z1231 Encounter for screening mammogram for malignant neoplasm of breast: Secondary | ICD-10-CM | POA: Diagnosis not present

## 2022-10-20 ENCOUNTER — Other Ambulatory Visit: Payer: Self-pay | Admitting: Oncology

## 2022-10-20 DIAGNOSIS — I2699 Other pulmonary embolism without acute cor pulmonale: Secondary | ICD-10-CM

## 2022-10-24 DIAGNOSIS — M5431 Sciatica, right side: Secondary | ICD-10-CM | POA: Diagnosis not present

## 2022-10-24 DIAGNOSIS — N1831 Chronic kidney disease, stage 3a: Secondary | ICD-10-CM | POA: Diagnosis not present

## 2022-10-24 DIAGNOSIS — R1032 Left lower quadrant pain: Secondary | ICD-10-CM | POA: Diagnosis not present

## 2022-10-24 DIAGNOSIS — D6859 Other primary thrombophilia: Secondary | ICD-10-CM | POA: Diagnosis not present

## 2022-10-24 DIAGNOSIS — I779 Disorder of arteries and arterioles, unspecified: Secondary | ICD-10-CM | POA: Diagnosis not present

## 2022-10-25 ENCOUNTER — Other Ambulatory Visit: Payer: Self-pay | Admitting: Family Medicine

## 2022-10-25 DIAGNOSIS — M5431 Sciatica, right side: Secondary | ICD-10-CM

## 2022-11-09 ENCOUNTER — Ambulatory Visit
Admission: RE | Admit: 2022-11-09 | Discharge: 2022-11-09 | Disposition: A | Discharge status: Home / Self Care | Payer: Medicare PPO | Source: Ambulatory Visit | Attending: Family Medicine | Admitting: Family Medicine

## 2022-11-09 DIAGNOSIS — M545 Low back pain, unspecified: Secondary | ICD-10-CM | POA: Diagnosis not present

## 2022-11-09 DIAGNOSIS — M5431 Sciatica, right side: Secondary | ICD-10-CM

## 2022-11-09 DIAGNOSIS — M47816 Spondylosis without myelopathy or radiculopathy, lumbar region: Secondary | ICD-10-CM | POA: Diagnosis not present

## 2022-11-21 DIAGNOSIS — M5431 Sciatica, right side: Secondary | ICD-10-CM | POA: Diagnosis not present

## 2022-11-21 DIAGNOSIS — M25551 Pain in right hip: Secondary | ICD-10-CM | POA: Diagnosis not present

## 2022-11-21 DIAGNOSIS — I10 Essential (primary) hypertension: Secondary | ICD-10-CM | POA: Diagnosis not present

## 2022-11-21 DIAGNOSIS — D6859 Other primary thrombophilia: Secondary | ICD-10-CM | POA: Diagnosis not present

## 2022-11-21 DIAGNOSIS — R1032 Left lower quadrant pain: Secondary | ICD-10-CM | POA: Diagnosis not present

## 2022-11-21 DIAGNOSIS — E7800 Pure hypercholesterolemia, unspecified: Secondary | ICD-10-CM | POA: Diagnosis not present

## 2022-11-28 ENCOUNTER — Other Ambulatory Visit: Payer: Self-pay | Admitting: Family Medicine

## 2022-11-28 ENCOUNTER — Ambulatory Visit
Admission: RE | Admit: 2022-11-28 | Discharge: 2022-11-28 | Disposition: A | Discharge status: Home / Self Care | Payer: Medicare PPO | Source: Ambulatory Visit | Attending: Family Medicine | Admitting: Family Medicine

## 2022-11-28 DIAGNOSIS — M25551 Pain in right hip: Secondary | ICD-10-CM | POA: Diagnosis not present

## 2022-11-28 DIAGNOSIS — M25552 Pain in left hip: Secondary | ICD-10-CM | POA: Diagnosis not present

## 2022-11-28 DIAGNOSIS — R1032 Left lower quadrant pain: Secondary | ICD-10-CM

## 2022-12-25 DIAGNOSIS — M25552 Pain in left hip: Secondary | ICD-10-CM | POA: Diagnosis not present

## 2023-02-19 DIAGNOSIS — Z7901 Long term (current) use of anticoagulants: Secondary | ICD-10-CM | POA: Diagnosis not present

## 2023-02-19 DIAGNOSIS — M810 Age-related osteoporosis without current pathological fracture: Secondary | ICD-10-CM | POA: Diagnosis not present

## 2023-02-19 DIAGNOSIS — I2510 Atherosclerotic heart disease of native coronary artery without angina pectoris: Secondary | ICD-10-CM | POA: Diagnosis not present

## 2023-02-19 DIAGNOSIS — M1990 Unspecified osteoarthritis, unspecified site: Secondary | ICD-10-CM | POA: Diagnosis not present

## 2023-02-19 DIAGNOSIS — N952 Postmenopausal atrophic vaginitis: Secondary | ICD-10-CM | POA: Diagnosis not present

## 2023-02-19 DIAGNOSIS — N1831 Chronic kidney disease, stage 3a: Secondary | ICD-10-CM | POA: Diagnosis not present

## 2023-02-19 DIAGNOSIS — I129 Hypertensive chronic kidney disease with stage 1 through stage 4 chronic kidney disease, or unspecified chronic kidney disease: Secondary | ICD-10-CM | POA: Diagnosis not present

## 2023-02-19 DIAGNOSIS — D6869 Other thrombophilia: Secondary | ICD-10-CM | POA: Diagnosis not present

## 2023-02-19 DIAGNOSIS — E785 Hyperlipidemia, unspecified: Secondary | ICD-10-CM | POA: Diagnosis not present

## 2023-02-23 DIAGNOSIS — R002 Palpitations: Secondary | ICD-10-CM | POA: Diagnosis not present

## 2023-02-23 DIAGNOSIS — R200 Anesthesia of skin: Secondary | ICD-10-CM | POA: Diagnosis not present

## 2023-04-17 DIAGNOSIS — M25552 Pain in left hip: Secondary | ICD-10-CM | POA: Diagnosis not present

## 2023-04-17 DIAGNOSIS — M5432 Sciatica, left side: Secondary | ICD-10-CM | POA: Diagnosis not present

## 2023-04-26 DIAGNOSIS — M5432 Sciatica, left side: Secondary | ICD-10-CM | POA: Diagnosis not present

## 2023-04-26 DIAGNOSIS — M25552 Pain in left hip: Secondary | ICD-10-CM | POA: Diagnosis not present

## 2023-05-01 DIAGNOSIS — M5432 Sciatica, left side: Secondary | ICD-10-CM | POA: Diagnosis not present

## 2023-05-01 DIAGNOSIS — M25552 Pain in left hip: Secondary | ICD-10-CM | POA: Diagnosis not present

## 2023-05-04 DIAGNOSIS — M25552 Pain in left hip: Secondary | ICD-10-CM | POA: Diagnosis not present

## 2023-05-04 DIAGNOSIS — M5432 Sciatica, left side: Secondary | ICD-10-CM | POA: Diagnosis not present

## 2023-05-07 DIAGNOSIS — E7800 Pure hypercholesterolemia, unspecified: Secondary | ICD-10-CM | POA: Diagnosis not present

## 2023-05-07 DIAGNOSIS — Z79899 Other long term (current) drug therapy: Secondary | ICD-10-CM | POA: Diagnosis not present

## 2023-05-08 DIAGNOSIS — M5432 Sciatica, left side: Secondary | ICD-10-CM | POA: Diagnosis not present

## 2023-05-08 DIAGNOSIS — M25552 Pain in left hip: Secondary | ICD-10-CM | POA: Diagnosis not present

## 2023-05-09 DIAGNOSIS — Z1331 Encounter for screening for depression: Secondary | ICD-10-CM | POA: Diagnosis not present

## 2023-05-09 DIAGNOSIS — Z0001 Encounter for general adult medical examination with abnormal findings: Secondary | ICD-10-CM | POA: Diagnosis not present

## 2023-05-09 DIAGNOSIS — M5432 Sciatica, left side: Secondary | ICD-10-CM | POA: Diagnosis not present

## 2023-05-09 DIAGNOSIS — D6859 Other primary thrombophilia: Secondary | ICD-10-CM | POA: Diagnosis not present

## 2023-05-09 DIAGNOSIS — I10 Essential (primary) hypertension: Secondary | ICD-10-CM | POA: Diagnosis not present

## 2023-05-09 DIAGNOSIS — R195 Other fecal abnormalities: Secondary | ICD-10-CM | POA: Diagnosis not present

## 2023-05-09 DIAGNOSIS — E7800 Pure hypercholesterolemia, unspecified: Secondary | ICD-10-CM | POA: Diagnosis not present

## 2023-05-09 DIAGNOSIS — Z79899 Other long term (current) drug therapy: Secondary | ICD-10-CM | POA: Diagnosis not present

## 2023-05-09 DIAGNOSIS — Z23 Encounter for immunization: Secondary | ICD-10-CM | POA: Diagnosis not present

## 2023-05-10 DIAGNOSIS — M5432 Sciatica, left side: Secondary | ICD-10-CM | POA: Diagnosis not present

## 2023-05-10 DIAGNOSIS — M25552 Pain in left hip: Secondary | ICD-10-CM | POA: Diagnosis not present

## 2023-05-14 DIAGNOSIS — M5432 Sciatica, left side: Secondary | ICD-10-CM | POA: Diagnosis not present

## 2023-05-14 DIAGNOSIS — M25552 Pain in left hip: Secondary | ICD-10-CM | POA: Diagnosis not present

## 2023-05-16 DIAGNOSIS — M5432 Sciatica, left side: Secondary | ICD-10-CM | POA: Diagnosis not present

## 2023-05-16 DIAGNOSIS — M25552 Pain in left hip: Secondary | ICD-10-CM | POA: Diagnosis not present

## 2023-05-22 DIAGNOSIS — M25552 Pain in left hip: Secondary | ICD-10-CM | POA: Diagnosis not present

## 2023-06-13 DIAGNOSIS — H5203 Hypermetropia, bilateral: Secondary | ICD-10-CM | POA: Diagnosis not present

## 2023-06-14 DIAGNOSIS — M533 Sacrococcygeal disorders, not elsewhere classified: Secondary | ICD-10-CM | POA: Diagnosis not present

## 2023-06-14 DIAGNOSIS — M545 Low back pain, unspecified: Secondary | ICD-10-CM | POA: Diagnosis not present

## 2023-06-20 ENCOUNTER — Encounter: Payer: Self-pay | Admitting: Cardiovascular Disease

## 2023-06-20 ENCOUNTER — Ambulatory Visit: Payer: Medicare PPO | Attending: Cardiovascular Disease | Admitting: Cardiovascular Disease

## 2023-06-20 VITALS — BP 130/80 | HR 69 | Ht 63.0 in | Wt 162.0 lb

## 2023-06-20 DIAGNOSIS — E782 Mixed hyperlipidemia: Secondary | ICD-10-CM | POA: Diagnosis not present

## 2023-06-20 DIAGNOSIS — I10 Essential (primary) hypertension: Secondary | ICD-10-CM | POA: Diagnosis not present

## 2023-06-20 NOTE — Progress Notes (Signed)
06/20/2023 Samantha Steele   1950/10/21  [id number]  Primary Physician Darrow Bussing, MD Primary Cardiologist: [name] MD Nicholes Calamity, [place]  HPI:  Samantha Steele is a 72 y.o.  72 year old married Caucasian female mother of 3 children referred by her PCP, Dr. Docia Chuck, for evaluation of atypical chest pain and shortness of breath.  I last saw her in the office 01/31/2022.  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  Her husband Gerlene Burdock accompanies her.  He is a Music therapist and a Education administrator.  Her cardiac risk factors notable for treated hypertension hyperlipidemia.  There is no family history of heart disease.  She is never had a heart attack or stroke.  She did have COVID over Christmas along with her husband.  She was fully vaccinated and boosted prior to that.  She developed a DVT and submassive pulmonary embolism 09/02/2021 has been on Eliquis ever since.  Her husband interestingly simultaneously had a stroke.  I did obtain a 2D echo which is entirely normal and a coronary calcium score is 11.  Venous Doppler showed resolution of previously demonstrated DVT.  We did adjust her statin drug to daily from q. OD.  LDL goal should be less than 70.  She did have some palpitations recently which resolved with adding back her oral magnesium.  Her major issue is of sciatica with pain in her hip when she walks.  Current Meds  Medication Sig   acetaminophen (TYLENOL) 500 MG tablet Take 500 mg by mouth every 6 (six) hours as needed for moderate pain.   apixaban (ELIQUIS) 2.5 MG TABS tablet Take 1 tablet (2.5 mg total) by mouth 2 (two) times daily. ALL FUTURE REFILLS PER DR. DIBAS KOIRALA   Cholecalciferol (VITAMIN D3) 50 MCG (2000 UT) capsule Take 2,000 Units by mouth daily.   estradiol (ESTRACE) 0.1 MG/GM vaginal cream Place 1 Applicatorful vaginally once a week.   loratadine (CLARITIN) 10 MG tablet    losartan (COZAAR) 25 MG tablet Take 25 mg by mouth daily.   MAGNESIUM  OXIDE, ELEMENTAL, PO Take 1 tablet by mouth daily at 6 (six) AM.   rosuvastatin (CRESTOR) 5 MG tablet Take 2.5 mg by mouth every Monday, Wednesday, and Friday.     Allergies  Allergen Reactions   Ampicillin Rash   Erythromycin Hives   Statins Other (See Comments)    Foggy head   Alendronate Sodium     Other reaction(s): jaw pain   Venlafaxine     Other reaction(s): non-therpeutic    Social History   Socioeconomic History   Marital status: Married    Spouse name: Not on file   Number of children: Not on file   Years of education: Not on file   Highest education level: Not on file  Occupational History   Not on file  Tobacco Use   Smoking status: Never   Smokeless tobacco: Never  Vaping Use   Vaping status: Never Used  Substance and Sexual Activity   Alcohol use: No   Drug use: No   Sexual activity: Yes    Partners: Male    Birth control/protection: Post-menopausal  Other Topics Concern   Not on file  Social History Narrative   Not on file   Social Determinants of Health   Financial Resource Strain: Not on file  Food Insecurity: Not on file  Transportation Needs: Not on file  Physical Activity: Not on file  Stress: Not on file  Social  Connections: Not on file  Intimate Partner Violence: Not on file     Review of Systems: General: negative for chills, fever, night sweats or weight changes.  Cardiovascular: negative for chest pain, dyspnea on exertion, edema, orthopnea, palpitations, paroxysmal nocturnal dyspnea or shortness of breath Dermatological: negative for rash Respiratory: negative for cough or wheezing Urologic: negative for hematuria Abdominal: negative for nausea, vomiting, diarrhea, bright red blood per rectum, melena, or hematemesis Neurologic: negative for visual changes, syncope, or dizziness All other systems reviewed and are otherwise negative except as noted above.    Blood pressure 130/80, pulse 69, height 5\' 3"  (1.6 m), weight 162 lb  (73.5 kg), SpO2 97%.  General appearance: alert and no distress Neck: no adenopathy, no carotid bruit, no JVD, supple, symmetrical, trachea midline, and thyroid not enlarged, symmetric, no tenderness/mass/nodules Lungs: clear to auscultation bilaterally Heart: regular rate and rhythm, S1, S2 normal, no murmur, click, rub or gallop Extremities: extremities normal, atraumatic, no cyanosis or edema Pulses: 2+ and symmetric Skin: Skin color, texture, turgor normal. No rashes or lesions Neurologic: Grossly normal  EKG EKG Interpretation Date/Time:  Wednesday June 20 2023 10:18:42 EST Ventricular Rate:  69 PR Interval:  152 QRS Duration:  84 QT Interval:  374 QTC Calculation: 400 R Axis:   1  Text Interpretation: Normal sinus rhythm Normal ECG When compared with ECG of 10-Jan-2022 13:32, No significant change was found Confirmed by Nanetta Batty [id number]) on 06/20/2023 10:44:07 AM    ASSESSMENT AND PLAN:   Essential hypertension History of essential hypertension with blood pressure measured today at 130/80.  She is on low-dose losartan.  Hyperlipidemia History of hyperlipidemia on low-dose rosuvastatin with lipid profile performed 05/07/2023 revealing total cholesterol 200, LDL 114 and HDL 51.  She is not at goal for secondary prevention with a coronary calcium score of 11.  Her LDL goal should be less than 70.  Her primary care physician is monitoring this.     [name] MD FACP,FACC,FAHA, [facility] 06/20/2023 10:54 AM

## 2023-06-20 NOTE — Patient Instructions (Addendum)
Medication Instructions:  No changes   *If you need a refill on your cardiac medications before your next appointment, please call your pharmacy*   Lab Work: Not needed  If you have labs (blood work) drawn today and your tests are completely normal, you will receive your results only by: MyChart Message (if you have MyChart) OR A paper copy in the mail If you have any lab test that is abnormal or we need to change your treatment, we will call you to review the results.   Testing/Procedures: Not  needed   Follow-Up: At [facility], you and your health needs are our priority.  As part of our continuing mission to provide you with exceptional heart care, we have created designated Provider Care Teams.  These Care Teams include your primary Cardiologist (physician) and Advanced Practice Providers (APPs -  Physician Assistants and Nurse Practitioners) who all work together to provide you with the care you need, when you need it.     Your next appointment:   6 month(s)  The format for your next appointment:   In Person  Provider:   Juanda Crumble, PA-C or Bernadene Person, NP    Then, Nanetta Batty, MD will plan to see you again in 12 month(s)

## 2023-06-20 NOTE — Assessment & Plan Note (Signed)
History of hyperlipidemia on low-dose rosuvastatin with lipid profile performed 05/07/2023 revealing total cholesterol 200, LDL 114 and HDL 51.  She is not at goal for secondary prevention with a coronary calcium score of 11.  Her LDL goal should be less than 70.  Her primary care physician is monitoring this.

## 2023-06-20 NOTE — Assessment & Plan Note (Signed)
History of essential hypertension with blood pressure measured today at 130/80.  She is on low-dose losartan.

## 2023-06-21 DIAGNOSIS — M533 Sacrococcygeal disorders, not elsewhere classified: Secondary | ICD-10-CM | POA: Diagnosis not present

## 2023-07-16 DIAGNOSIS — E2839 Other primary ovarian failure: Secondary | ICD-10-CM | POA: Diagnosis not present

## 2023-07-16 DIAGNOSIS — Z8262 Family history of osteoporosis: Secondary | ICD-10-CM | POA: Diagnosis not present

## 2023-07-16 DIAGNOSIS — M8588 Other specified disorders of bone density and structure, other site: Secondary | ICD-10-CM | POA: Diagnosis not present

## 2023-07-30 DIAGNOSIS — L6611 Classic lichen planopilaris: Secondary | ICD-10-CM | POA: Diagnosis not present

## 2023-07-30 DIAGNOSIS — L438 Other lichen planus: Secondary | ICD-10-CM | POA: Diagnosis not present

## 2023-07-30 DIAGNOSIS — L570 Actinic keratosis: Secondary | ICD-10-CM | POA: Diagnosis not present

## 2023-07-30 DIAGNOSIS — L814 Other melanin hyperpigmentation: Secondary | ICD-10-CM | POA: Diagnosis not present

## 2023-07-30 DIAGNOSIS — L821 Other seborrheic keratosis: Secondary | ICD-10-CM | POA: Diagnosis not present

## 2023-07-30 DIAGNOSIS — D225 Melanocytic nevi of trunk: Secondary | ICD-10-CM | POA: Diagnosis not present

## 2023-07-30 IMAGING — DX DG CHEST 2V
2 series · 2 of 2 positions shown · non-contrast
Comparison: Chest x-ray 12/20/2007.

CLINICAL DATA: Tachycardia.

EXAM:
CHEST - 2 VIEW

[chest pa]
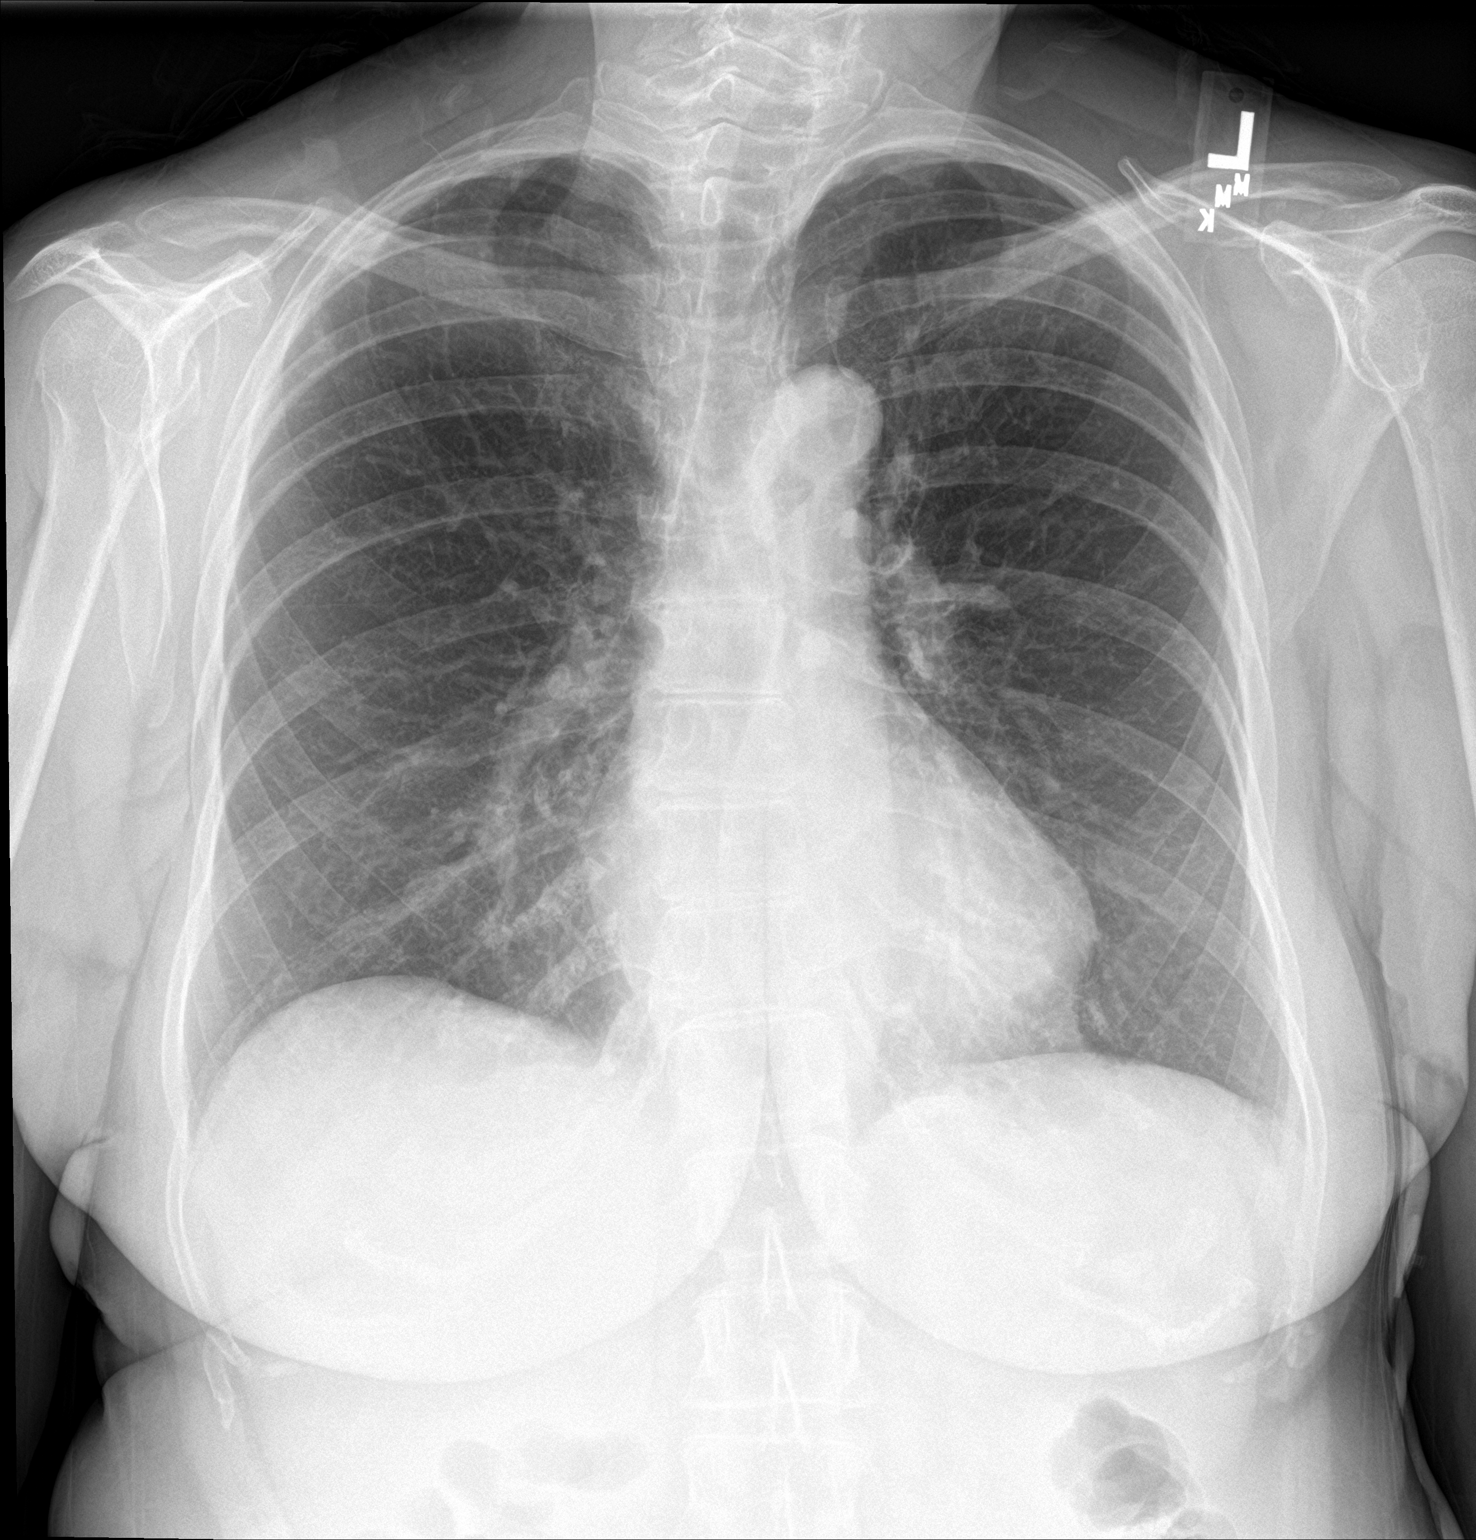

[chest lat]
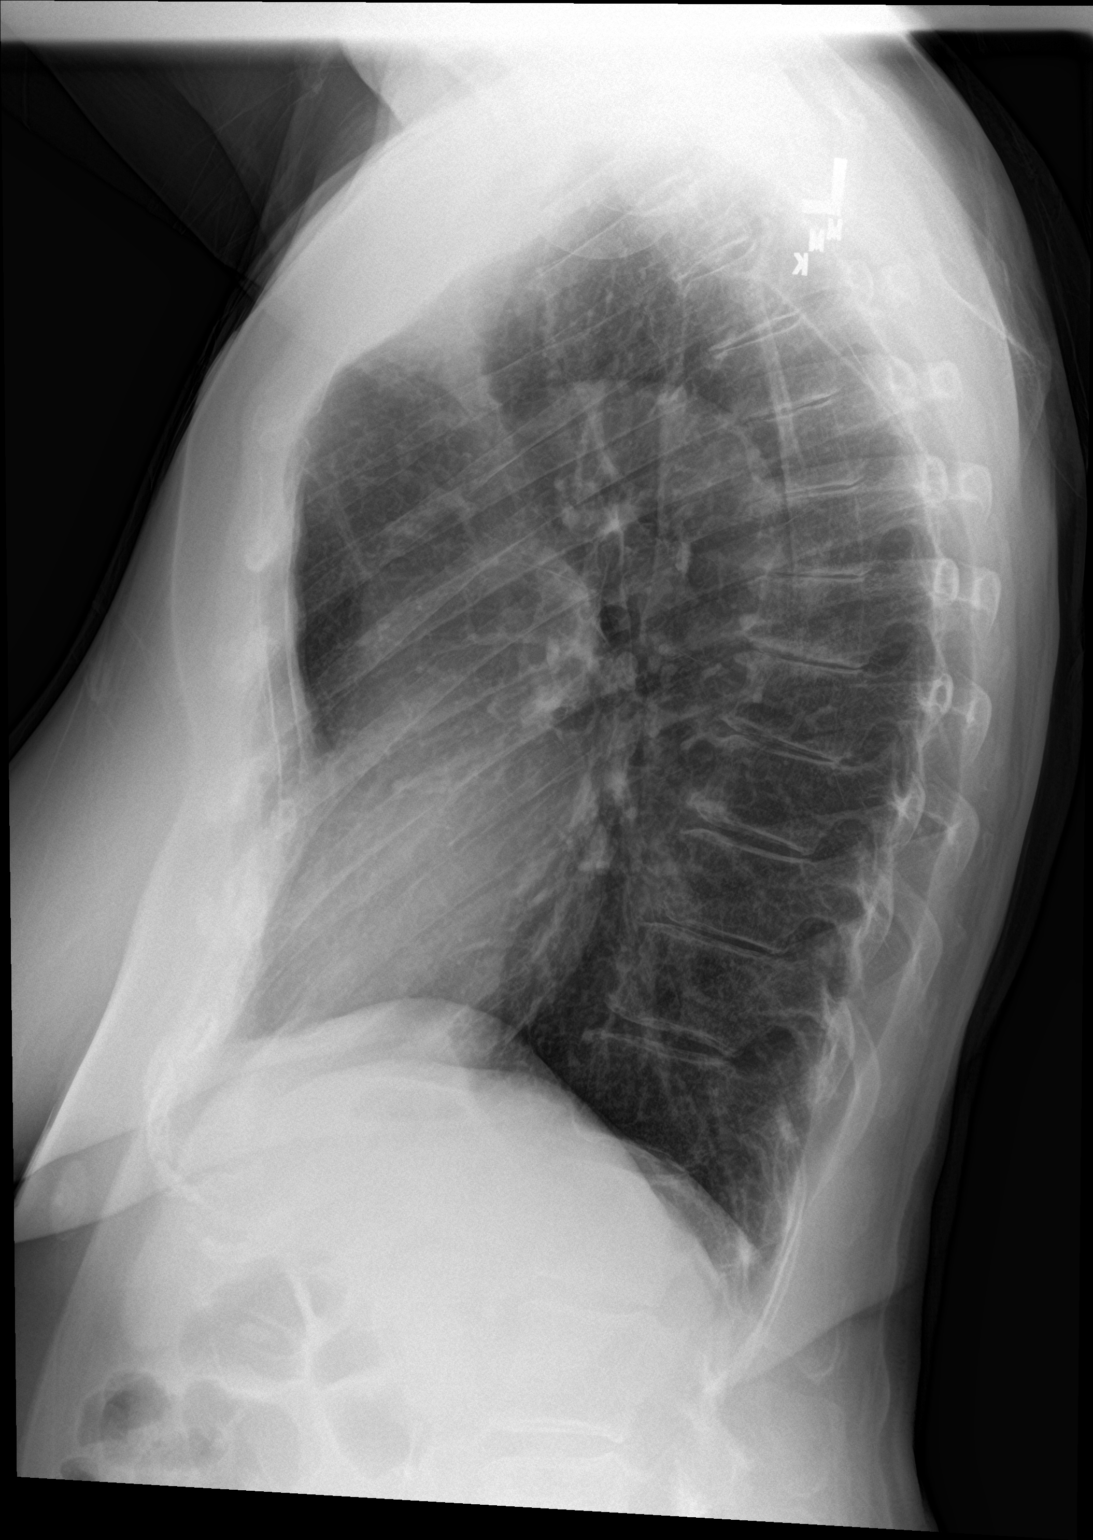

[2 of 2 positions shown; findings below may reference images not displayed]

FINDINGS: The heart size and mediastinal contours are within normal limits.
Both lungs are clear. The visualized skeletal structures are
unremarkable.
IMPRESSION: No active cardiopulmonary disease.

## 2023-07-30 IMAGING — CT CT ANGIO CHEST
2 of 7 series · 18 of 46 positions shown · IV contrast (APPLIED)
Comparison: Chest x-ray 09/02/2021

CLINICAL DATA: Shortness of breath and tachycardia

EXAM:
CT ANGIOGRAPHY CHEST WITH CONTRAST
TECHNIQUE: Multidetector CT imaging of the chest was performed using the
standard protocol during bolus administration of intravenous
contrast. Multiplanar CT image reconstructions and MIPs were
obtained to evaluate the vascular anatomy.

[Series 7: thins · axial · 0.63mm/px · z∈[+1217,+1480]mm · 15 of 424 slices shown]
[im 24/424  lung]
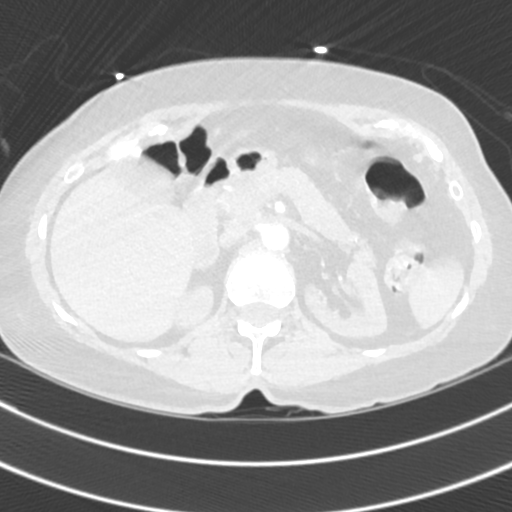
[im 48/424  soft-tissue]
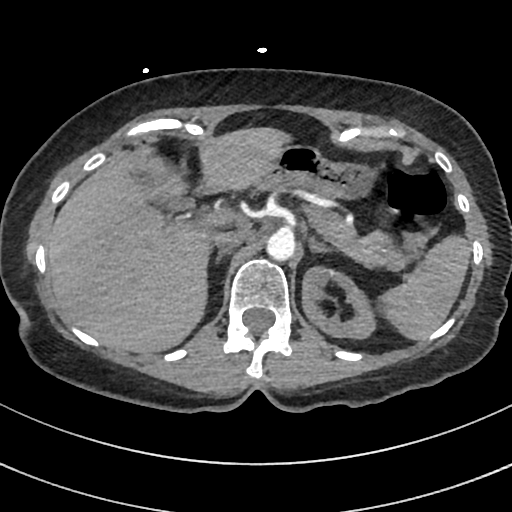
[im 71/424  lung]
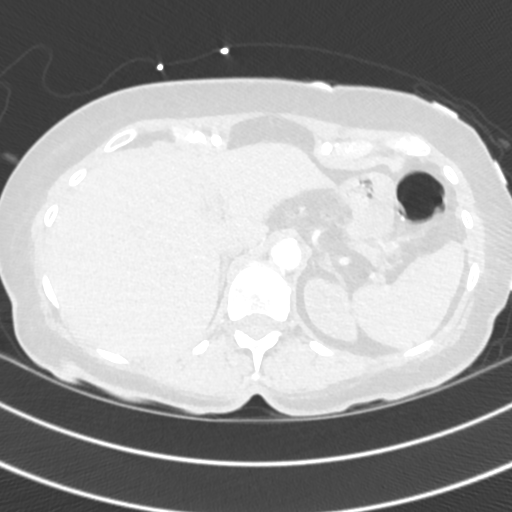
[im 95/424  soft-tissue]
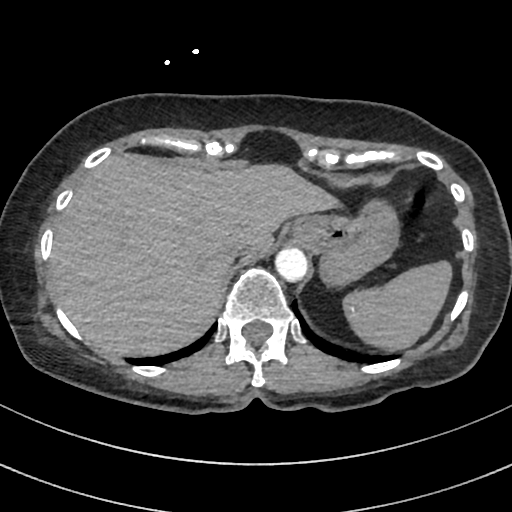
[im 142/424  lung]
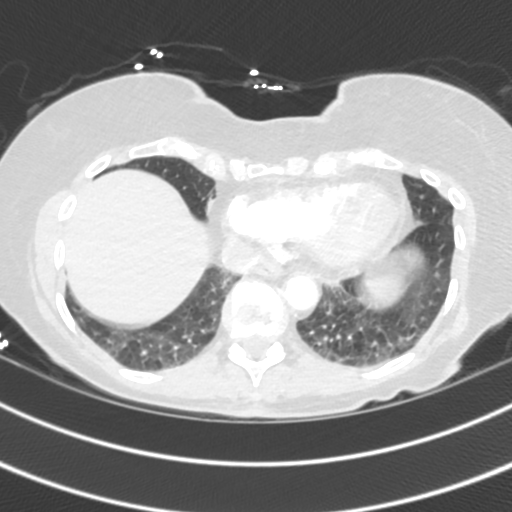
[im 165/424  soft-tissue]
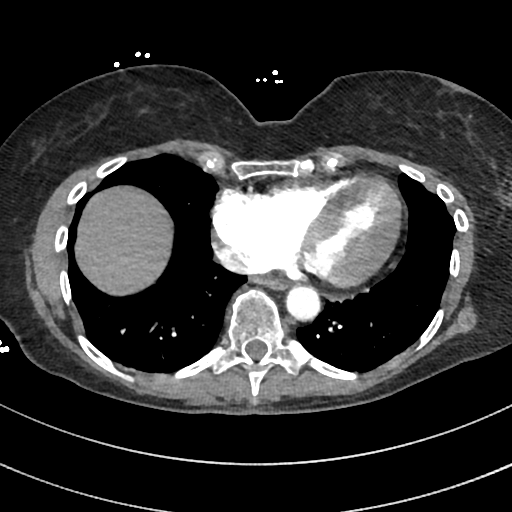
[im 189/424  lung]
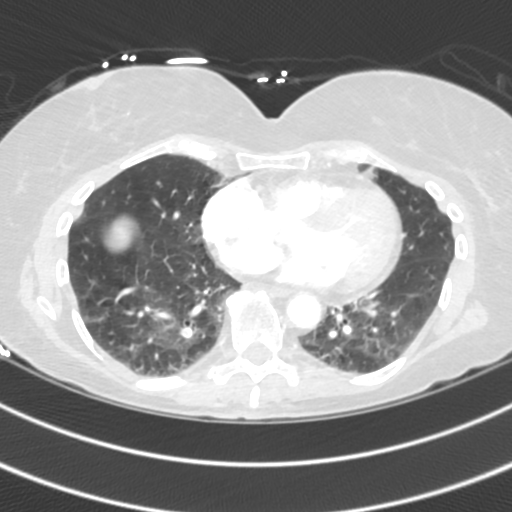
[im 212/424  soft-tissue]
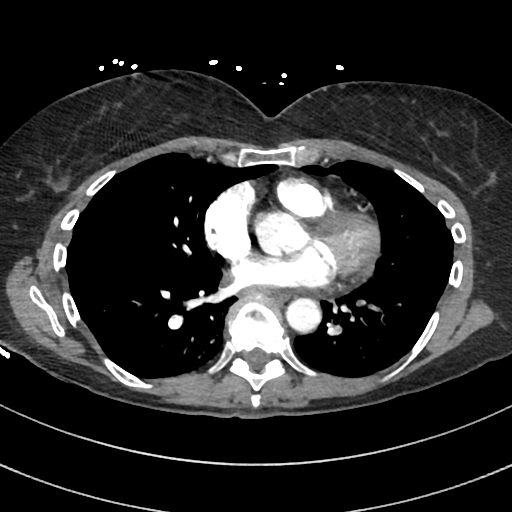
[im 236/424  lung]
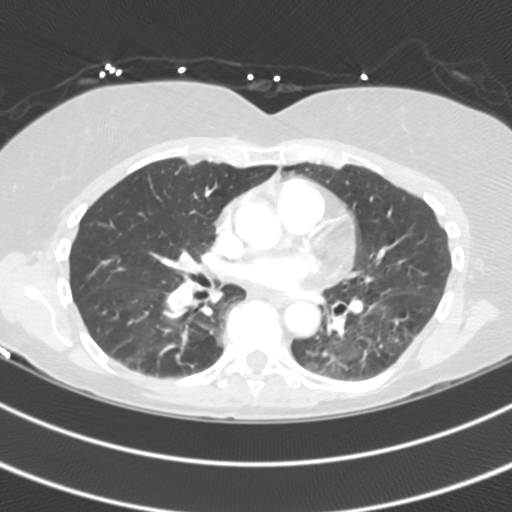
[im 259/424  soft-tissue]
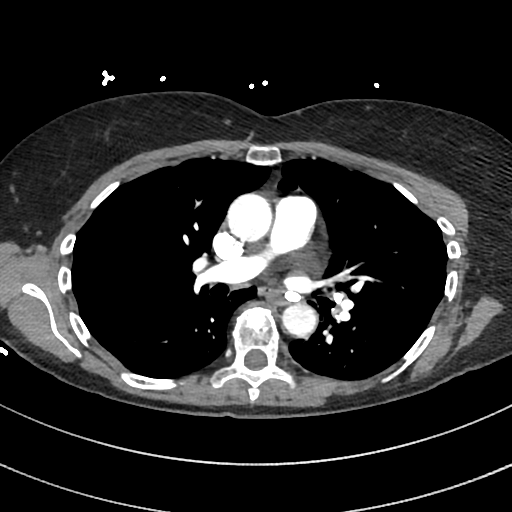
[im 283/424  lung]
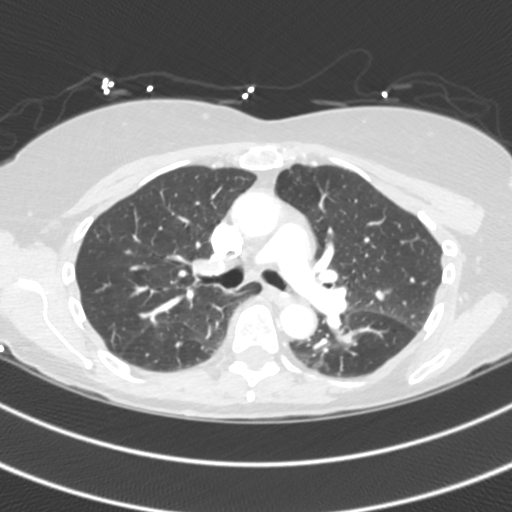
[im 330/424  soft-tissue]
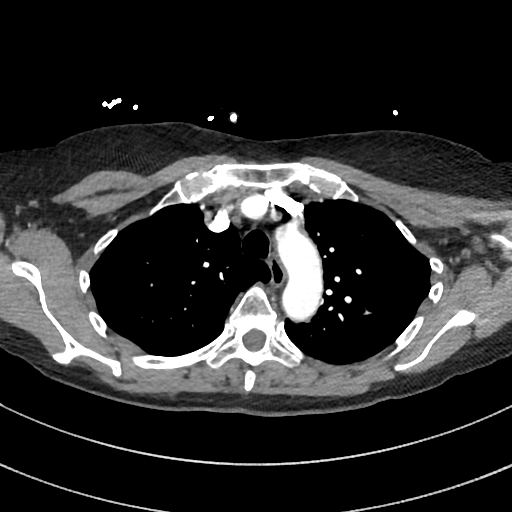
[im 353/424  lung]
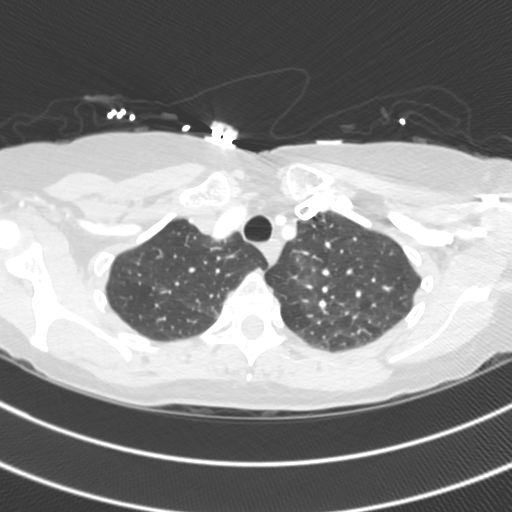
[im 377/424  soft-tissue]
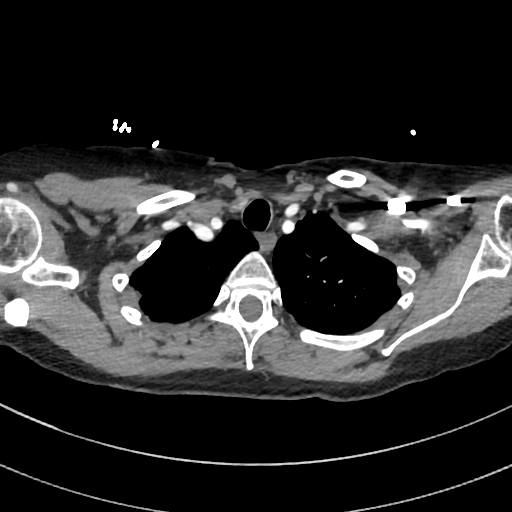
[im 400/424  lung]
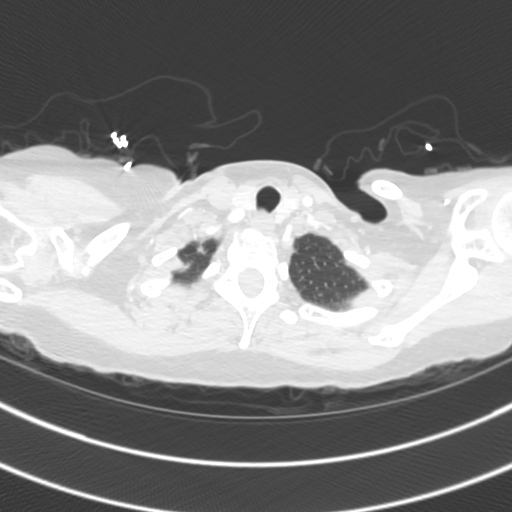

[Series 8: cor · coronal · 0.61mm/px · 3 of 117 slices shown]
[im 30/117  soft-tissue]
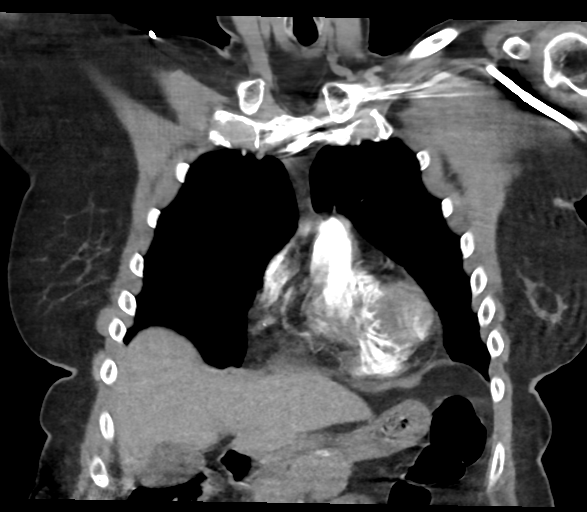
[im 59/117  soft-tissue]
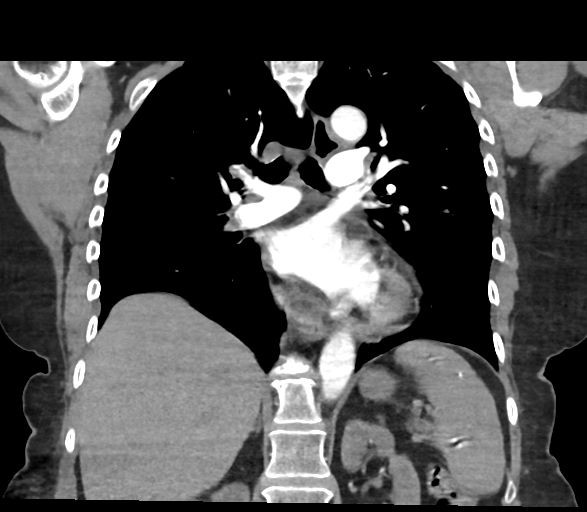
[im 88/117  soft-tissue]
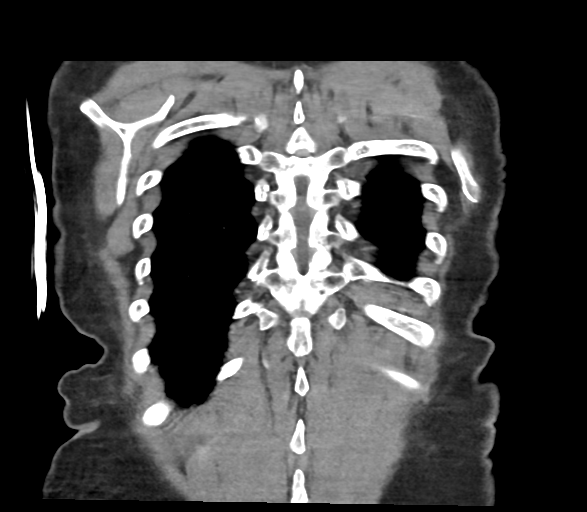

[18 of 46 positions shown; findings below may reference images not displayed]

RADIATION DOSE REDUCTION: This exam was performed according to the
departmental dose-optimization program which includes automated
exposure control, adjustment of the mA and/or kV according to
patient size and/or use of iterative reconstruction technique.

CONTRAST:  65mL OMNIPAQUE IOHEXOL 350 MG/ML SOLN
FINDINGS: Cardiovascular: Satisfactory opacification of the pulmonary arteries
to the segmental level. Positive for acute thrombus within the right
upper lobar and segmental vessels. Acute thrombus within right
middle lobe segmental and subsegmental vessels with thrombus present
in the right descending pulmonary artery and right lower lobe
segmental and subsegmental vessels. Acute thrombus present within
left upper lobe subsegmental vessels, and multiple left lower lobe
segmental and subsegmental vessels. Elevated RV LV ratio of 1.18.
There appears to be hypertrophy of the left ventricle. Normal
cardiac size. No pericardial effusion. Nonaneurysmal aorta.

Mediastinum/Nodes: Midline trachea. No thyroid mass. No suspicious
lymph nodes. Esophagus within normal limits

Lungs/Pleura: No consolidation, pleural effusion or pneumothorax.
Mild mosaic density which may be due to diffuse atelectasis, small
airways disease or potential mosaic perfusion given bilateral PE.

Upper Abdomen: No acute abnormality.  Splenic granuloma.

Musculoskeletal: No chest wall abnormality. No acute or significant
osseous findings.

Review of the MIP images confirms the above findings.
IMPRESSION: Positive for acute right greater than left pulmonary emboli.
Positive for acute PE with CT evidence of right heart strain (RV/LV
Ratio = 1.18) consistent with at least submassive (intermediate
risk) PE. The presence of right heart strain has been associated
with an increased risk of morbidity and mortality. Please refer to
the "PE Focused" order set in [REDACTED].

Negative for acute airspace disease.

Critical Value/emergent results were called by telephone at the time
of interpretation on 09/02/2021 at [DATE] to provider JAREL
AHDIAH , who verbally acknowledged these results.

## 2023-08-20 DIAGNOSIS — Z7901 Long term (current) use of anticoagulants: Secondary | ICD-10-CM | POA: Diagnosis not present

## 2023-08-20 DIAGNOSIS — Z86711 Personal history of pulmonary embolism: Secondary | ICD-10-CM | POA: Diagnosis not present

## 2023-08-20 DIAGNOSIS — Z86718 Personal history of other venous thrombosis and embolism: Secondary | ICD-10-CM | POA: Diagnosis not present

## 2023-08-20 DIAGNOSIS — R195 Other fecal abnormalities: Secondary | ICD-10-CM | POA: Diagnosis not present

## 2023-09-05 DIAGNOSIS — N958 Other specified menopausal and perimenopausal disorders: Secondary | ICD-10-CM | POA: Diagnosis not present

## 2023-09-05 DIAGNOSIS — Z9189 Other specified personal risk factors, not elsewhere classified: Secondary | ICD-10-CM | POA: Diagnosis not present

## 2023-09-05 DIAGNOSIS — Z01419 Encounter for gynecological examination (general) (routine) without abnormal findings: Secondary | ICD-10-CM | POA: Diagnosis not present

## 2023-09-05 DIAGNOSIS — I10 Essential (primary) hypertension: Secondary | ICD-10-CM | POA: Diagnosis not present

## 2023-10-08 DIAGNOSIS — K648 Other hemorrhoids: Secondary | ICD-10-CM | POA: Diagnosis not present

## 2023-10-08 DIAGNOSIS — R195 Other fecal abnormalities: Secondary | ICD-10-CM | POA: Diagnosis not present

## 2023-10-22 DIAGNOSIS — E7800 Pure hypercholesterolemia, unspecified: Secondary | ICD-10-CM | POA: Diagnosis not present

## 2023-10-22 DIAGNOSIS — R42 Dizziness and giddiness: Secondary | ICD-10-CM | POA: Diagnosis not present

## 2023-10-22 DIAGNOSIS — Z23 Encounter for immunization: Secondary | ICD-10-CM | POA: Diagnosis not present

## 2023-10-22 DIAGNOSIS — I10 Essential (primary) hypertension: Secondary | ICD-10-CM | POA: Diagnosis not present

## 2023-10-22 DIAGNOSIS — N1831 Chronic kidney disease, stage 3a: Secondary | ICD-10-CM | POA: Diagnosis not present

## 2023-10-22 DIAGNOSIS — D6859 Other primary thrombophilia: Secondary | ICD-10-CM | POA: Diagnosis not present

## 2023-10-22 DIAGNOSIS — Z79899 Other long term (current) drug therapy: Secondary | ICD-10-CM | POA: Diagnosis not present

## 2023-12-07 IMAGING — CT CT ANGIO CHEST
2 of 7 series · 18 of 46 positions shown · IV contrast (APPLIED)
Comparison: 09/02/2021

CLINICAL DATA: Intermittent chest pain for 2 weeks, shortness of
breath, presyncope

EXAM:
CT ANGIOGRAPHY CHEST WITH CONTRAST
TECHNIQUE: Multidetector CT imaging of the chest was performed using the
standard protocol during bolus administration of intravenous
contrast. Multiplanar CT image reconstructions and MIPs were
obtained to evaluate the vascular anatomy.

[Series 6: thins · axial · 0.75mm/px · z∈[-224,+31]mm · 15 of 410 slices shown]
[im 23/410  lung]
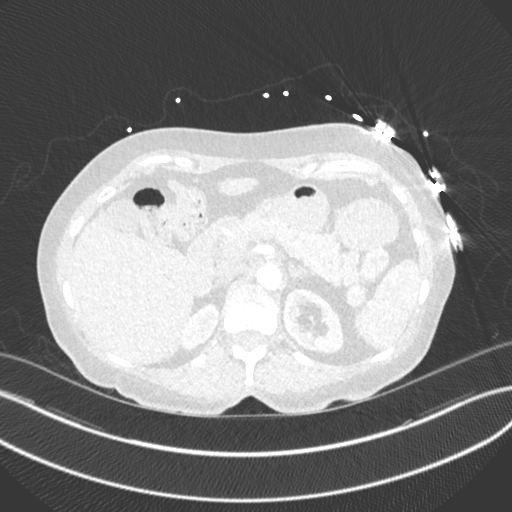
[im 46/410  soft-tissue]
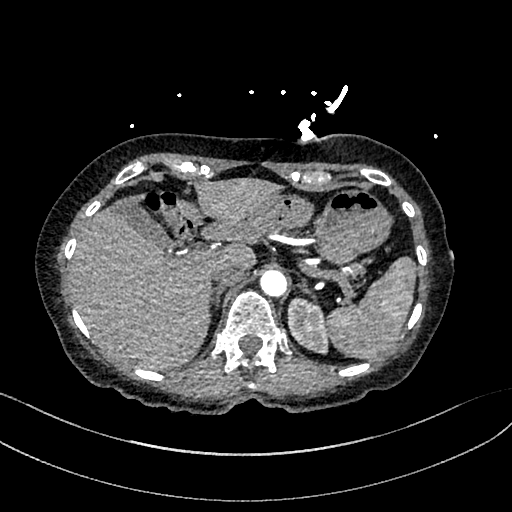
[im 69/410  lung]
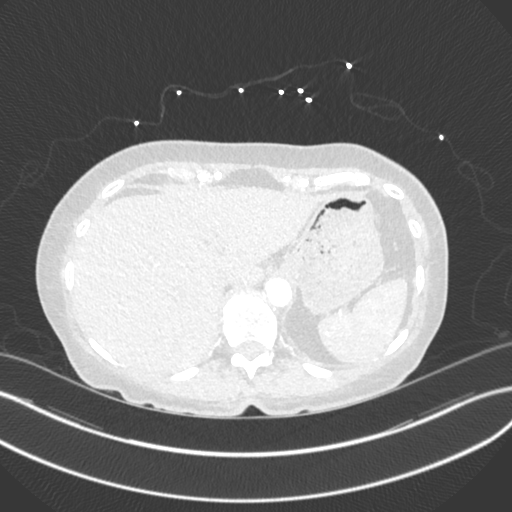
[im 91/410  soft-tissue]
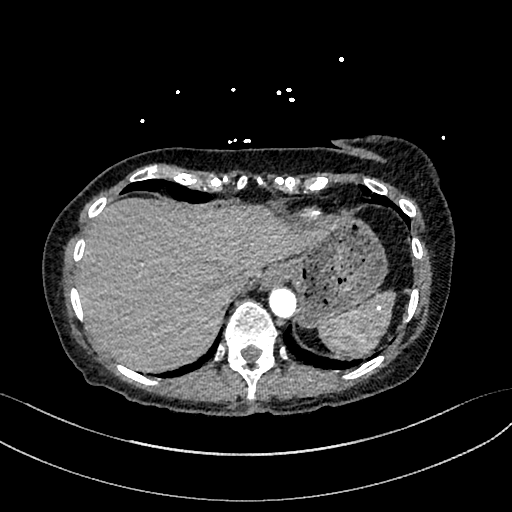
[im 137/410  lung]
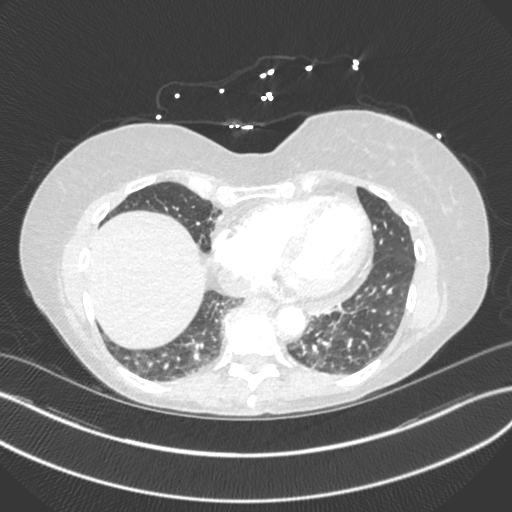
[im 160/410  soft-tissue]
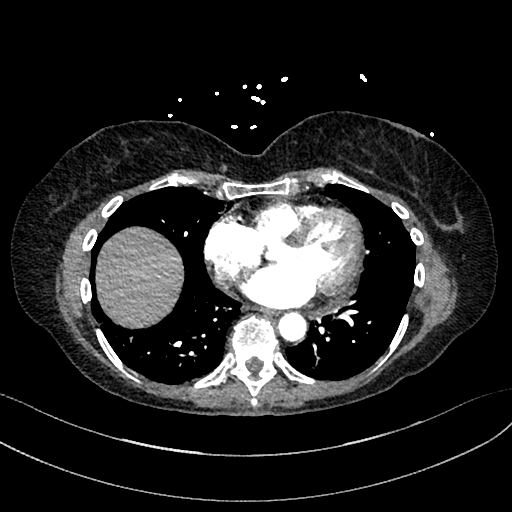
[im 182/410  lung]
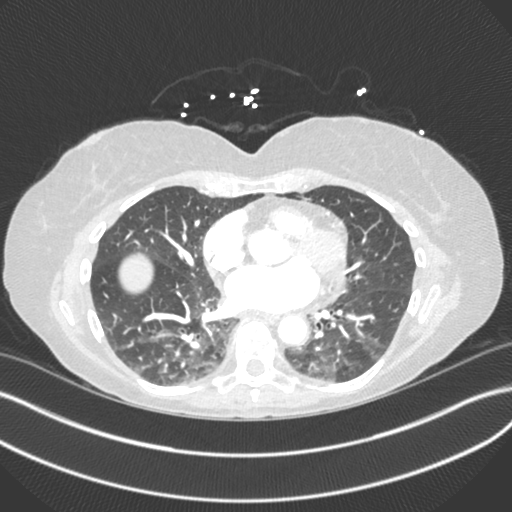
[im 205/410  soft-tissue]
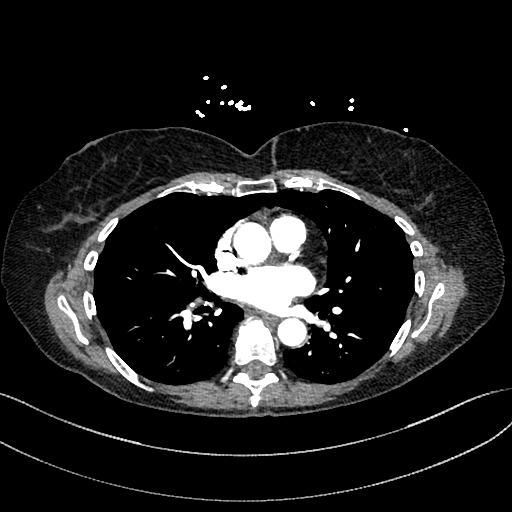
[im 228/410  lung]
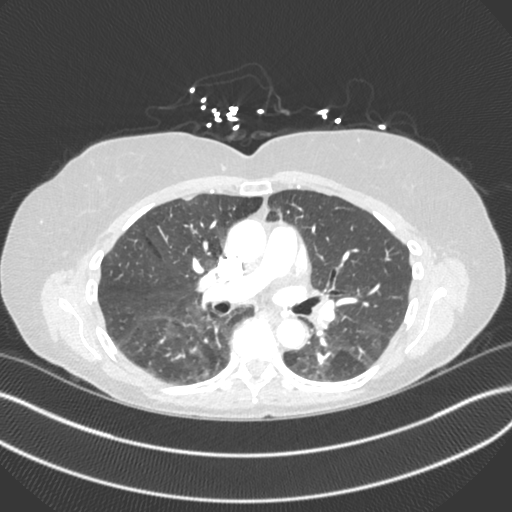
[im 250/410  soft-tissue]
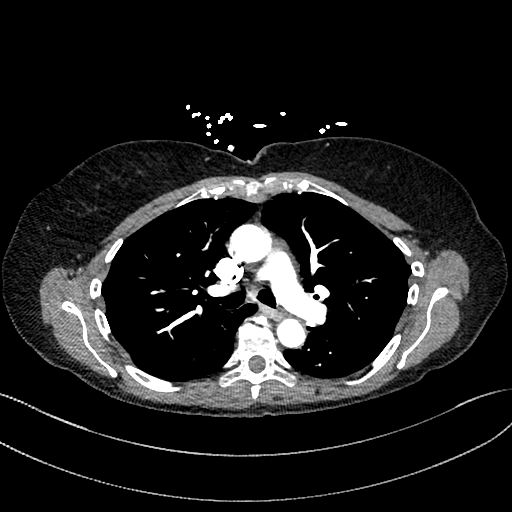
[im 273/410  lung]
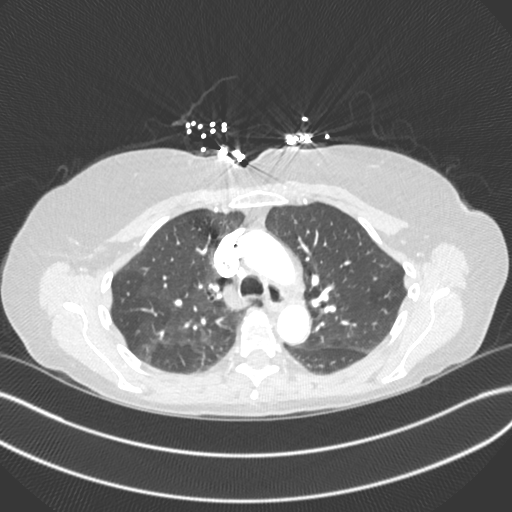
[im 319/410  soft-tissue]
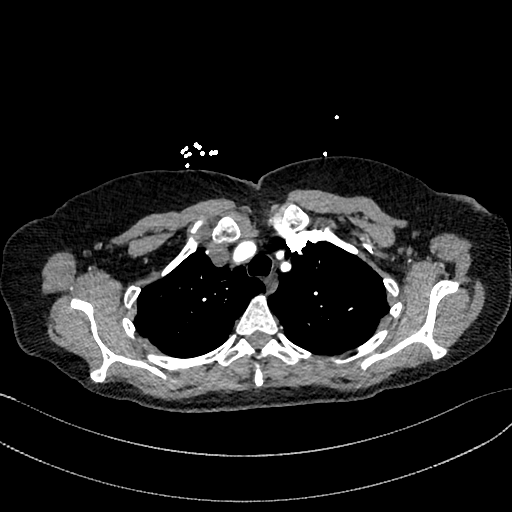
[im 341/410  lung]
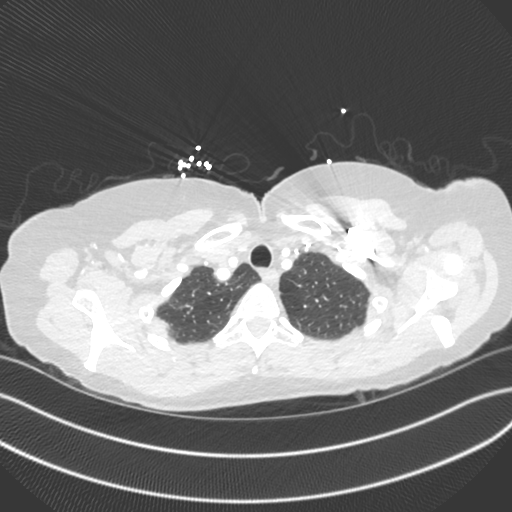
[im 364/410  soft-tissue]
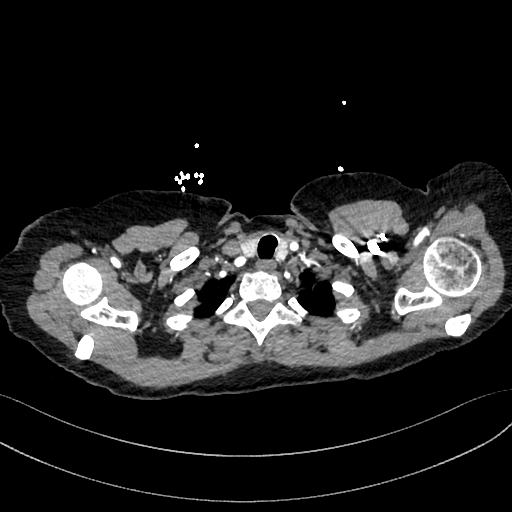
[im 387/410  lung]
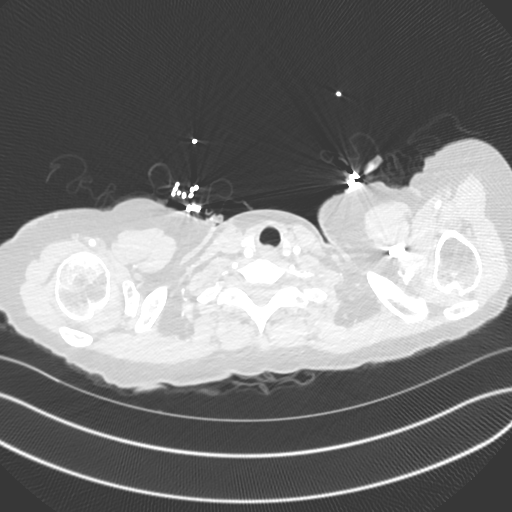

[Series 8: cor · coronal · 0.59mm/px · 3 of 117 slices shown]
[im 30/117  soft-tissue]
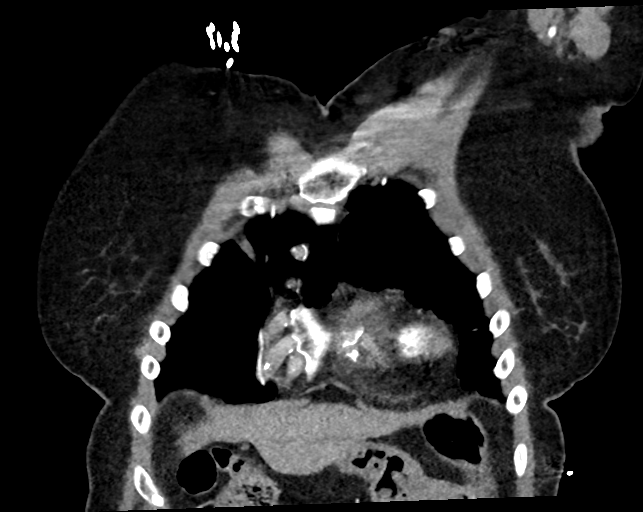
[im 59/117  soft-tissue]
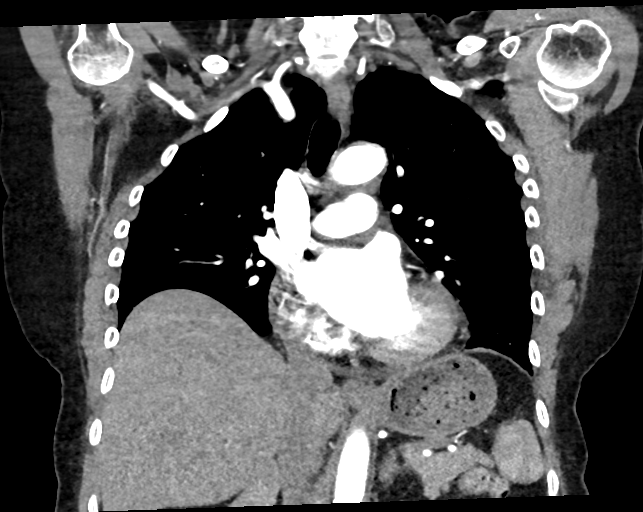
[im 88/117  soft-tissue]
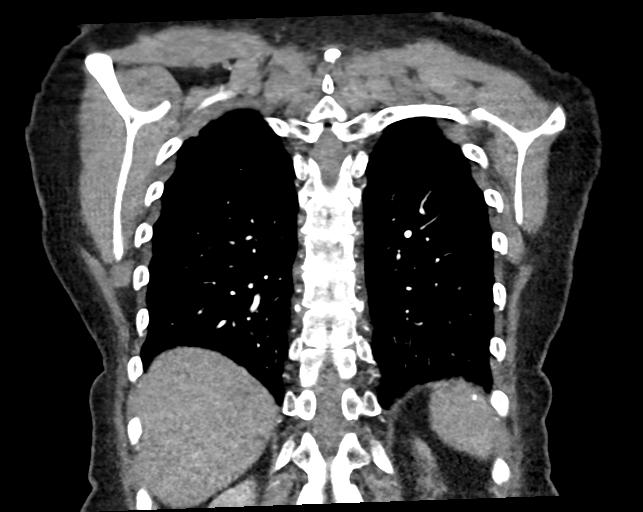

[18 of 46 positions shown; findings below may reference images not displayed]

RADIATION DOSE REDUCTION: This exam was performed according to the
departmental dose-optimization program which includes automated
exposure control, adjustment of the mA and/or kV according to
patient size and/or use of iterative reconstruction technique.

CONTRAST:  100mL OMNIPAQUE IOHEXOL 350 MG/ML SOLN
FINDINGS: Cardiovascular: This is a technically adequate evaluation of the
pulmonary vasculature. No filling defects or pulmonary emboli.

The heart is unremarkable without pericardial effusion. No evidence
of thoracic aortic aneurysm or dissection.

Mediastinum/Nodes: No enlarged mediastinal, hilar, or axillary lymph
nodes. Thyroid gland, trachea, and esophagus demonstrate no
significant findings.

Lungs/Pleura: Dependent hypoventilatory changes within the lower
lobes. No acute airspace disease, effusion, or pneumothorax. Central
airways are patent.

Upper Abdomen: No acute abnormality.

Musculoskeletal: No acute or destructive bony lesions. Reconstructed
images demonstrate no additional findings.

Review of the MIP images confirms the above findings.
IMPRESSION: 1. No evidence of pulmonary embolus.
2. No acute intrathoracic process.

## 2023-12-13 DIAGNOSIS — Z86711 Personal history of pulmonary embolism: Secondary | ICD-10-CM | POA: Diagnosis not present

## 2023-12-13 DIAGNOSIS — I10 Essential (primary) hypertension: Secondary | ICD-10-CM | POA: Diagnosis not present

## 2023-12-13 DIAGNOSIS — M810 Age-related osteoporosis without current pathological fracture: Secondary | ICD-10-CM | POA: Diagnosis not present

## 2023-12-13 DIAGNOSIS — Z7901 Long term (current) use of anticoagulants: Secondary | ICD-10-CM | POA: Diagnosis not present

## 2023-12-13 DIAGNOSIS — E785 Hyperlipidemia, unspecified: Secondary | ICD-10-CM | POA: Diagnosis not present

## 2023-12-13 DIAGNOSIS — I872 Venous insufficiency (chronic) (peripheral): Secondary | ICD-10-CM | POA: Diagnosis not present

## 2024-02-26 DIAGNOSIS — R002 Palpitations: Secondary | ICD-10-CM | POA: Diagnosis not present

## 2024-02-26 DIAGNOSIS — Z86718 Personal history of other venous thrombosis and embolism: Secondary | ICD-10-CM | POA: Diagnosis not present

## 2024-02-26 DIAGNOSIS — Z7901 Long term (current) use of anticoagulants: Secondary | ICD-10-CM | POA: Diagnosis not present

## 2024-02-26 DIAGNOSIS — Z86711 Personal history of pulmonary embolism: Secondary | ICD-10-CM | POA: Diagnosis not present

## 2024-02-29 ENCOUNTER — Encounter: Payer: Self-pay | Admitting: Advanced Practice Midwife

## 2024-02-29 DIAGNOSIS — I10 Essential (primary) hypertension: Secondary | ICD-10-CM | POA: Diagnosis not present

## 2024-03-05 DIAGNOSIS — R5382 Chronic fatigue, unspecified: Secondary | ICD-10-CM | POA: Diagnosis not present

## 2024-03-05 DIAGNOSIS — Z86718 Personal history of other venous thrombosis and embolism: Secondary | ICD-10-CM | POA: Diagnosis not present

## 2024-03-05 DIAGNOSIS — R0600 Dyspnea, unspecified: Secondary | ICD-10-CM | POA: Diagnosis not present

## 2024-03-05 DIAGNOSIS — Z86711 Personal history of pulmonary embolism: Secondary | ICD-10-CM | POA: Diagnosis not present

## 2024-03-05 DIAGNOSIS — Z7901 Long term (current) use of anticoagulants: Secondary | ICD-10-CM | POA: Diagnosis not present

## 2024-03-05 DIAGNOSIS — I10 Essential (primary) hypertension: Secondary | ICD-10-CM | POA: Diagnosis not present

## 2024-03-05 DIAGNOSIS — R002 Palpitations: Secondary | ICD-10-CM | POA: Diagnosis not present

## 2024-03-06 DIAGNOSIS — R0602 Shortness of breath: Secondary | ICD-10-CM | POA: Diagnosis not present

## 2024-03-13 DIAGNOSIS — R079 Chest pain, unspecified: Secondary | ICD-10-CM | POA: Diagnosis not present

## 2024-03-13 DIAGNOSIS — R7989 Other specified abnormal findings of blood chemistry: Secondary | ICD-10-CM | POA: Diagnosis not present

## 2024-03-13 DIAGNOSIS — Z743 Need for continuous supervision: Secondary | ICD-10-CM | POA: Diagnosis not present

## 2024-03-13 DIAGNOSIS — Z880 Allergy status to penicillin: Secondary | ICD-10-CM | POA: Diagnosis not present

## 2024-03-13 DIAGNOSIS — R0602 Shortness of breath: Secondary | ICD-10-CM | POA: Diagnosis not present

## 2024-03-13 DIAGNOSIS — R69 Illness, unspecified: Secondary | ICD-10-CM | POA: Diagnosis not present

## 2024-03-13 DIAGNOSIS — I10 Essential (primary) hypertension: Secondary | ICD-10-CM | POA: Diagnosis not present

## 2024-03-13 DIAGNOSIS — R531 Weakness: Secondary | ICD-10-CM | POA: Diagnosis not present

## 2024-03-13 DIAGNOSIS — Z881 Allergy status to other antibiotic agents status: Secondary | ICD-10-CM | POA: Diagnosis not present

## 2024-03-13 DIAGNOSIS — R5383 Other fatigue: Secondary | ICD-10-CM | POA: Diagnosis not present

## 2024-03-13 DIAGNOSIS — E785 Hyperlipidemia, unspecified: Secondary | ICD-10-CM | POA: Diagnosis not present

## 2024-03-13 DIAGNOSIS — R0609 Other forms of dyspnea: Secondary | ICD-10-CM | POA: Diagnosis not present

## 2024-03-13 DIAGNOSIS — R0789 Other chest pain: Secondary | ICD-10-CM | POA: Diagnosis not present

## 2024-03-13 DIAGNOSIS — Z86711 Personal history of pulmonary embolism: Secondary | ICD-10-CM | POA: Diagnosis not present

## 2024-03-14 DIAGNOSIS — R0609 Other forms of dyspnea: Secondary | ICD-10-CM | POA: Diagnosis not present

## 2024-03-14 DIAGNOSIS — R079 Chest pain, unspecified: Secondary | ICD-10-CM | POA: Diagnosis not present

## 2024-03-14 DIAGNOSIS — R5382 Chronic fatigue, unspecified: Secondary | ICD-10-CM | POA: Diagnosis not present

## 2024-03-14 DIAGNOSIS — I10 Essential (primary) hypertension: Secondary | ICD-10-CM | POA: Diagnosis not present

## 2024-03-14 DIAGNOSIS — Z86711 Personal history of pulmonary embolism: Secondary | ICD-10-CM | POA: Diagnosis not present

## 2024-03-14 DIAGNOSIS — Z136 Encounter for screening for cardiovascular disorders: Secondary | ICD-10-CM | POA: Diagnosis not present

## 2024-03-14 DIAGNOSIS — R531 Weakness: Secondary | ICD-10-CM | POA: Diagnosis not present

## 2024-03-14 DIAGNOSIS — R0602 Shortness of breath: Secondary | ICD-10-CM | POA: Diagnosis not present

## 2024-03-19 DIAGNOSIS — R0600 Dyspnea, unspecified: Secondary | ICD-10-CM | POA: Diagnosis not present

## 2024-03-19 DIAGNOSIS — R002 Palpitations: Secondary | ICD-10-CM | POA: Diagnosis not present

## 2024-03-19 DIAGNOSIS — R5382 Chronic fatigue, unspecified: Secondary | ICD-10-CM | POA: Diagnosis not present

## 2024-03-19 DIAGNOSIS — I10 Essential (primary) hypertension: Secondary | ICD-10-CM | POA: Diagnosis not present

## 2024-03-19 DIAGNOSIS — Z7901 Long term (current) use of anticoagulants: Secondary | ICD-10-CM | POA: Diagnosis not present

## 2024-03-19 DIAGNOSIS — R5381 Other malaise: Secondary | ICD-10-CM | POA: Diagnosis not present

## 2024-03-19 DIAGNOSIS — Z86718 Personal history of other venous thrombosis and embolism: Secondary | ICD-10-CM | POA: Diagnosis not present

## 2024-03-19 DIAGNOSIS — Z86711 Personal history of pulmonary embolism: Secondary | ICD-10-CM | POA: Diagnosis not present

## 2024-03-24 DIAGNOSIS — R262 Difficulty in walking, not elsewhere classified: Secondary | ICD-10-CM | POA: Diagnosis not present

## 2024-03-24 DIAGNOSIS — R0602 Shortness of breath: Secondary | ICD-10-CM | POA: Diagnosis not present

## 2024-03-24 DIAGNOSIS — T887XXS Unspecified adverse effect of drug or medicament, sequela: Secondary | ICD-10-CM | POA: Diagnosis not present

## 2024-03-24 DIAGNOSIS — R69 Illness, unspecified: Secondary | ICD-10-CM | POA: Diagnosis not present

## 2024-03-24 DIAGNOSIS — R2681 Unsteadiness on feet: Secondary | ICD-10-CM | POA: Diagnosis not present

## 2024-03-24 DIAGNOSIS — R5383 Other fatigue: Secondary | ICD-10-CM | POA: Diagnosis not present

## 2024-03-24 DIAGNOSIS — Z86711 Personal history of pulmonary embolism: Secondary | ICD-10-CM | POA: Diagnosis not present

## 2024-04-01 DIAGNOSIS — T887XXS Unspecified adverse effect of drug or medicament, sequela: Secondary | ICD-10-CM | POA: Diagnosis not present

## 2024-04-01 DIAGNOSIS — R5383 Other fatigue: Secondary | ICD-10-CM | POA: Diagnosis not present

## 2024-04-01 DIAGNOSIS — R69 Illness, unspecified: Secondary | ICD-10-CM | POA: Diagnosis not present

## 2024-04-01 DIAGNOSIS — R2681 Unsteadiness on feet: Secondary | ICD-10-CM | POA: Diagnosis not present

## 2024-04-01 DIAGNOSIS — Z86711 Personal history of pulmonary embolism: Secondary | ICD-10-CM | POA: Diagnosis not present

## 2024-04-01 DIAGNOSIS — R262 Difficulty in walking, not elsewhere classified: Secondary | ICD-10-CM | POA: Diagnosis not present

## 2024-04-01 DIAGNOSIS — R0602 Shortness of breath: Secondary | ICD-10-CM | POA: Diagnosis not present

## 2024-04-02 DIAGNOSIS — Z86711 Personal history of pulmonary embolism: Secondary | ICD-10-CM | POA: Diagnosis not present

## 2024-04-02 DIAGNOSIS — R002 Palpitations: Secondary | ICD-10-CM | POA: Diagnosis not present

## 2024-04-02 DIAGNOSIS — R0600 Dyspnea, unspecified: Secondary | ICD-10-CM | POA: Diagnosis not present

## 2024-04-02 DIAGNOSIS — I10 Essential (primary) hypertension: Secondary | ICD-10-CM | POA: Diagnosis not present

## 2024-04-02 DIAGNOSIS — R5383 Other fatigue: Secondary | ICD-10-CM | POA: Diagnosis not present

## 2024-04-02 DIAGNOSIS — E785 Hyperlipidemia, unspecified: Secondary | ICD-10-CM | POA: Diagnosis not present

## 2024-04-02 DIAGNOSIS — Z86718 Personal history of other venous thrombosis and embolism: Secondary | ICD-10-CM | POA: Diagnosis not present

## 2024-04-02 DIAGNOSIS — Z7901 Long term (current) use of anticoagulants: Secondary | ICD-10-CM | POA: Diagnosis not present

## 2024-04-03 DIAGNOSIS — Z86711 Personal history of pulmonary embolism: Secondary | ICD-10-CM | POA: Diagnosis not present

## 2024-04-03 DIAGNOSIS — R262 Difficulty in walking, not elsewhere classified: Secondary | ICD-10-CM | POA: Diagnosis not present

## 2024-04-03 DIAGNOSIS — R5383 Other fatigue: Secondary | ICD-10-CM | POA: Diagnosis not present

## 2024-04-03 DIAGNOSIS — T887XXS Unspecified adverse effect of drug or medicament, sequela: Secondary | ICD-10-CM | POA: Diagnosis not present

## 2024-04-03 DIAGNOSIS — R2681 Unsteadiness on feet: Secondary | ICD-10-CM | POA: Diagnosis not present

## 2024-04-03 DIAGNOSIS — R0602 Shortness of breath: Secondary | ICD-10-CM | POA: Diagnosis not present

## 2024-04-03 DIAGNOSIS — R69 Illness, unspecified: Secondary | ICD-10-CM | POA: Diagnosis not present

## 2024-04-04 DIAGNOSIS — E559 Vitamin D deficiency, unspecified: Secondary | ICD-10-CM | POA: Diagnosis not present

## 2024-04-04 DIAGNOSIS — D519 Vitamin B12 deficiency anemia, unspecified: Secondary | ICD-10-CM | POA: Diagnosis not present

## 2024-04-04 DIAGNOSIS — R5381 Other malaise: Secondary | ICD-10-CM | POA: Diagnosis not present

## 2024-04-04 DIAGNOSIS — I10 Essential (primary) hypertension: Secondary | ICD-10-CM | POA: Diagnosis not present

## 2024-04-08 DIAGNOSIS — R5383 Other fatigue: Secondary | ICD-10-CM | POA: Diagnosis not present

## 2024-04-08 DIAGNOSIS — T887XXS Unspecified adverse effect of drug or medicament, sequela: Secondary | ICD-10-CM | POA: Diagnosis not present

## 2024-04-08 DIAGNOSIS — R0602 Shortness of breath: Secondary | ICD-10-CM | POA: Diagnosis not present

## 2024-04-08 DIAGNOSIS — R2681 Unsteadiness on feet: Secondary | ICD-10-CM | POA: Diagnosis not present

## 2024-04-08 DIAGNOSIS — R69 Illness, unspecified: Secondary | ICD-10-CM | POA: Diagnosis not present

## 2024-04-08 DIAGNOSIS — R262 Difficulty in walking, not elsewhere classified: Secondary | ICD-10-CM | POA: Diagnosis not present

## 2024-04-08 DIAGNOSIS — Z86711 Personal history of pulmonary embolism: Secondary | ICD-10-CM | POA: Diagnosis not present

## 2024-04-10 DIAGNOSIS — R69 Illness, unspecified: Secondary | ICD-10-CM | POA: Diagnosis not present

## 2024-04-10 DIAGNOSIS — R262 Difficulty in walking, not elsewhere classified: Secondary | ICD-10-CM | POA: Diagnosis not present

## 2024-04-10 DIAGNOSIS — R0602 Shortness of breath: Secondary | ICD-10-CM | POA: Diagnosis not present

## 2024-04-10 DIAGNOSIS — T887XXS Unspecified adverse effect of drug or medicament, sequela: Secondary | ICD-10-CM | POA: Diagnosis not present

## 2024-04-10 DIAGNOSIS — R5383 Other fatigue: Secondary | ICD-10-CM | POA: Diagnosis not present

## 2024-04-10 DIAGNOSIS — R2681 Unsteadiness on feet: Secondary | ICD-10-CM | POA: Diagnosis not present

## 2024-04-10 DIAGNOSIS — Z86711 Personal history of pulmonary embolism: Secondary | ICD-10-CM | POA: Diagnosis not present

## 2024-04-16 DIAGNOSIS — R5383 Other fatigue: Secondary | ICD-10-CM | POA: Diagnosis not present

## 2024-04-16 DIAGNOSIS — T887XXS Unspecified adverse effect of drug or medicament, sequela: Secondary | ICD-10-CM | POA: Diagnosis not present

## 2024-04-16 DIAGNOSIS — R262 Difficulty in walking, not elsewhere classified: Secondary | ICD-10-CM | POA: Diagnosis not present

## 2024-04-16 DIAGNOSIS — R2681 Unsteadiness on feet: Secondary | ICD-10-CM | POA: Diagnosis not present

## 2024-04-16 DIAGNOSIS — Z86711 Personal history of pulmonary embolism: Secondary | ICD-10-CM | POA: Diagnosis not present

## 2024-04-18 DIAGNOSIS — R5383 Other fatigue: Secondary | ICD-10-CM | POA: Diagnosis not present

## 2024-04-18 DIAGNOSIS — Z86711 Personal history of pulmonary embolism: Secondary | ICD-10-CM | POA: Diagnosis not present

## 2024-04-18 DIAGNOSIS — R2681 Unsteadiness on feet: Secondary | ICD-10-CM | POA: Diagnosis not present

## 2024-04-18 DIAGNOSIS — R262 Difficulty in walking, not elsewhere classified: Secondary | ICD-10-CM | POA: Diagnosis not present

## 2024-04-18 DIAGNOSIS — T887XXS Unspecified adverse effect of drug or medicament, sequela: Secondary | ICD-10-CM | POA: Diagnosis not present

## 2024-04-21 DIAGNOSIS — R5383 Other fatigue: Secondary | ICD-10-CM | POA: Diagnosis not present

## 2024-04-21 DIAGNOSIS — T887XXS Unspecified adverse effect of drug or medicament, sequela: Secondary | ICD-10-CM | POA: Diagnosis not present

## 2024-04-21 DIAGNOSIS — R2681 Unsteadiness on feet: Secondary | ICD-10-CM | POA: Diagnosis not present

## 2024-04-21 DIAGNOSIS — R262 Difficulty in walking, not elsewhere classified: Secondary | ICD-10-CM | POA: Diagnosis not present

## 2024-04-21 DIAGNOSIS — Z86711 Personal history of pulmonary embolism: Secondary | ICD-10-CM | POA: Diagnosis not present

## 2024-04-24 DIAGNOSIS — R5383 Other fatigue: Secondary | ICD-10-CM | POA: Diagnosis not present

## 2024-04-24 DIAGNOSIS — T887XXS Unspecified adverse effect of drug or medicament, sequela: Secondary | ICD-10-CM | POA: Diagnosis not present

## 2024-04-24 DIAGNOSIS — R262 Difficulty in walking, not elsewhere classified: Secondary | ICD-10-CM | POA: Diagnosis not present

## 2024-04-24 DIAGNOSIS — R2681 Unsteadiness on feet: Secondary | ICD-10-CM | POA: Diagnosis not present

## 2024-04-24 DIAGNOSIS — Z86711 Personal history of pulmonary embolism: Secondary | ICD-10-CM | POA: Diagnosis not present

## 2024-04-28 DIAGNOSIS — R2681 Unsteadiness on feet: Secondary | ICD-10-CM | POA: Diagnosis not present

## 2024-04-28 DIAGNOSIS — Z86711 Personal history of pulmonary embolism: Secondary | ICD-10-CM | POA: Diagnosis not present

## 2024-04-28 DIAGNOSIS — T887XXS Unspecified adverse effect of drug or medicament, sequela: Secondary | ICD-10-CM | POA: Diagnosis not present

## 2024-04-28 DIAGNOSIS — R262 Difficulty in walking, not elsewhere classified: Secondary | ICD-10-CM | POA: Diagnosis not present

## 2024-04-28 DIAGNOSIS — R5383 Other fatigue: Secondary | ICD-10-CM | POA: Diagnosis not present

## 2024-05-05 DIAGNOSIS — R053 Chronic cough: Secondary | ICD-10-CM | POA: Diagnosis not present

## 2024-05-05 DIAGNOSIS — R5382 Chronic fatigue, unspecified: Secondary | ICD-10-CM | POA: Diagnosis not present

## 2024-05-05 DIAGNOSIS — Z86711 Personal history of pulmonary embolism: Secondary | ICD-10-CM | POA: Diagnosis not present

## 2024-05-05 DIAGNOSIS — U099 Post covid-19 condition, unspecified: Secondary | ICD-10-CM | POA: Diagnosis not present

## 2024-05-05 DIAGNOSIS — R0602 Shortness of breath: Secondary | ICD-10-CM | POA: Diagnosis not present
